# Patient Record
Sex: Male | Born: 1975 | Race: White | Marital: Married | Smoking: Never smoker
Health system: Northeastern US, Academic
[De-identification: ages and names within clinical notes are randomized; demographics above are authoritative.]

## PROBLEM LIST (undated history)

## (undated) DIAGNOSIS — E119 Type 2 diabetes mellitus without complications: Secondary | ICD-10-CM

## (undated) DIAGNOSIS — F419 Anxiety disorder, unspecified: Secondary | ICD-10-CM

## (undated) DIAGNOSIS — E785 Hyperlipidemia, unspecified: Secondary | ICD-10-CM

## (undated) HISTORY — DX: Type 2 diabetes mellitus without complications: E11.9

## (undated) HISTORY — DX: Hyperlipidemia, unspecified: E78.5

## (undated) HISTORY — DX: Anxiety disorder, unspecified: F41.9

---

## 2004-09-09 DIAGNOSIS — N200 Calculus of kidney: Secondary | ICD-10-CM | POA: Insufficient documentation

## 2004-09-09 DIAGNOSIS — K589 Irritable bowel syndrome without diarrhea: Secondary | ICD-10-CM | POA: Insufficient documentation

## 2009-10-17 ENCOUNTER — Encounter: Payer: Self-pay | Admitting: Gastroenterology

## 2009-10-17 ENCOUNTER — Ambulatory Visit: Payer: Self-pay | Admitting: Internal Medicine

## 2009-10-17 ENCOUNTER — Ambulatory Visit
Admit: 2009-10-17 | Discharge: 2009-10-17 | Disposition: A | Discharge status: Home / Self Care | Payer: Self-pay | Source: Ambulatory Visit | Attending: Internal Medicine | Admitting: Internal Medicine

## 2009-10-17 DIAGNOSIS — E785 Hyperlipidemia, unspecified: Secondary | ICD-10-CM | POA: Insufficient documentation

## 2009-10-17 LAB — CBC AND DIFFERENTIAL
Baso # K/uL: 0 THOU/uL (ref 0.0–0.1)
Basophil %: 0.4 % (ref 0.2–1.2)
Eos # K/uL: 0.1 THOU/uL (ref 0.0–0.5)
Eosinophil %: 1.7 % (ref 0.8–7.0)
Hematocrit: 43 % (ref 40–51)
Hemoglobin: 15.7 g/dL (ref 13.7–17.5)
Lymph # K/uL: 3 THOU/uL (ref 1.3–3.6)
Lymphocyte %: 38.1 % (ref 21.8–53.1)
MCV: 84 fL (ref 79–92)
Mono # K/uL: 0.4 THOU/uL (ref 0.3–0.8)
Monocyte %: 4.9 % — ABNORMAL LOW (ref 5.3–12.2)
Neut # K/uL: 4.3 THOU/uL (ref 1.8–5.4)
Platelets: 216 THOU/uL (ref 150–330)
RBC: 5.2 MIL/uL (ref 4.6–6.1)
RDW: 12.5 % (ref 11.6–14.4)
Seg Neut %: 54.9 % (ref 34.0–67.9)
WBC: 7.8 THOU/uL (ref 4.2–9.1)

## 2009-10-17 LAB — COMPREHENSIVE METABOLIC PANEL
ALT: 30 U/L (ref 0–50)
AST: 20 U/L (ref 0–50)
Albumin: 4.6 g/dL (ref 3.5–5.2)
Alk Phos: 52 U/L (ref 40–130)
Anion Gap: 15 (ref 7–16)
Bilirubin,Total: 0.5 mg/dL (ref 0.0–1.2)
CO2: 27 mmol/L (ref 20–28)
Calcium: 9.5 mg/dL (ref 9.0–10.3)
Chloride: 91 mmol/L — ABNORMAL LOW (ref 96–108)
Creatinine: 0.73 mg/dL (ref 0.67–1.17)
GFR,Black: >59 *
GFR,Caucasian: >59 *
Glucose: 525 mg/dL (ref 74–106)
Lab: 13 mg/dL (ref 6–20)
Potassium: 4.1 mmol/L (ref 3.3–5.1)
Sodium: 133 mmol/L (ref 133–145)
Total Protein: 7.6 g/dL (ref 6.3–7.7)

## 2009-10-17 LAB — FOLATE: Folate: 14.3 ng/mL (ref >=4.6)

## 2009-10-17 LAB — VITAMIN B12: Vitamin B12: 560 pg/mL (ref 211–946)

## 2009-10-17 LAB — TSH: TSH: 1.69 u[IU]/mL (ref 0.27–4.20)

## 2009-10-17 NOTE — Progress Notes (Signed)
 Message   Anxiety, neuropathy, weight loss, increased urination and thirst, scalp   lesion.     He thinks he is anxious with the possibility of having some depression and   OCD.  He claims he always had some anxiety with OCD.  He likes everything   in order.  However, he does get more frustrated and upset if things do not   go right away.  Of note, he is busy as an Production designer, theatre/television/film as well as Runner, broadcasting/film/video   at [employer].  He does have a 8-year-old daughter expecting a new child   around September.  He does not get a lot of sleep as he thinks about a lot   of things.  He feels tired the next day because of the lack of sleep.  He   does not have many hobbies.  He doesn't do a lot of things for fun.  He   does like to read but does not have a chance.  He does not exercise regular   basis but is active during the day.     He occasionally will feel some numbness along the lateral aspect of his   index fingers bilaterally.  Also get some funny numbness in his feet.     He is been thirsty.  He is drinking more.  His mouth is dry.  He is   urinating more but is not sure if it's related to drinking more.     He has lost some weight but he does not think he's been trying.  He is down   a good 20 pounds.  He is happy with the weight loss.  Of note, he does not   want to gain weight even with medication side effects or do to his wife   being pregnant.     His libido has been fine.  However, does not match his voice at this time.     He does have a scab on the back of his head to the bottom of his scalp.  It   was raised to 1 point persists.     He is trying to be more careful with his diet but is not perfect.     He has talked about these issues with his wife.  He does not do too many   others about it but thinks his mother is aware.     On physical exam, no acute distress but does appear to be mildly anxious.    Heart rate is on the upper end of normal.  Quite regular.  He is anicteric   not pale.  Conjunctiva not injected.  Throat  without incident or erythema.    He does have a slightly raised normal pigmented area at the back of his   scalp is consistent with an old folliculitis.  Next a full.  No   lymphadenopathy.  Chest is clear auscultation.  CV regular in rhythm, S1,   S2 without murmur or gallop.  Abdomen is benign with positive bowel sounds.    Extremities are without edema.  No gross neurological deficits in his   upper extremities.  2+ distal pulses.     Impression.     Weight loss.  Likely related to anxiety.  We'll do some blood work   including thyroid tests.     Generalized anxiety disorder.  Likely component of OCD.  Unclear of some   depression as well.  Not suicidal or homicidal. He likely needs an  SSRI but   is hesitant to start any medications.  He does not want to speak to a   therapist at this point.  Abdomen the lung work but in talking on the phone   over the next few days to try to start an SSRI like citalopram or   sertraline.     Neuropathy.  I will do some blood work but benign exam.  I do not think he   has a vitamin deficiency but will check.  I do not think he has diabetes   but we'll check a sugar.  I think his increased thirst and urination is due   to anxiety.     Scalp lesion.  Benign.  We'll monitor.     I'll see him back in a few weeks.  Allergies   Penicillins.  Current Meds   None.  Active Problems   Hyperlipidemia (272.4)  Irritable Bowel Syndrome (564.1)  Nephrolithiasis (592.0).  Vital Signs   Recorded by Jackalyn Lombard on 17 Oct 2009 03:10 PM  Temp: 36.8 C,   Weight: 97.2 kg,   Pain Scale: 0.  Recorded by Erasmo Score on 17 Oct 2009 03:10 PM  BP:128/76,  LUE,  Sitting,   HR: 92 b/min, Regular,   Resp: 16 r/min, Normal,   Temp: 36.8 C,   Weight: 97.2 kg,   Pain Scale: 0.  Signature   Electronically signed by: Amador Cunas  MD Attend.; 10/17/2009 5:22 PM   EST; Chartered loss adjuster.

## 2009-10-18 ENCOUNTER — Ambulatory Visit: Payer: Self-pay | Admitting: Internal Medicine

## 2009-10-18 DIAGNOSIS — E119 Type 2 diabetes mellitus without complications: Secondary | ICD-10-CM | POA: Insufficient documentation

## 2009-10-18 NOTE — Progress Notes (Signed)
 Reason For Visit   Diabetes mellitus newly diagnosed.  greater than 15 minutes were discussing   diagnosis, prognosis, treatment.     I saw him yesterday-please see note for further details.     He has been having polyuria, polydipsia for several months.  He thinks it   goes back to about 6 months.  Although he thought it was related to stress,   he also had been worrying about some metabolic process.  He does notice   some numbness in his fingers and feet.  He also has been very hungry.  No   bouts of feeling lightheaded.  he has lost weight.     We discussed diet at length.  we discussed the role of carbohydrates in   moderation.  He also eats sweets intermittently.  Does not exercise on a   regular basis but is active.     He does claim a grandparent did have diabetes.     He does teach high school biology and so is aware of some of the   pathophysiology related to diabetes.  He thinks he can give himself an   insulin injection once daily as well as check his sugars on a regular   basis.  However, he does not want to stay on insulin for every possible.     No physical exam done.     Impression.     Diabetes mellitus newly diagnosed with sugars above 500 and with symptoms.    I will start him on Lantus insulin 10 units and titrate upward.  I want to   avoid hypoglycemia.  We'll have him check sugars on a regular basis.  He   will get instructions for a nurse.  Wife present.  No contact me next week   and I will see him in 2-3 weeks.  We will have him see a nutritionist.  Allergies   Penicillins.  Current Meds   Bayer Contour Test Strip;TEST three times a day and if needed. Dx insulin   controlled DM.; Rx  Lancets Ultra Thin Miscellaneous;test BG 3 times daily and PRN. diabetes on   insulin.; Rx  Lantus SoloStar 100 UNIT/ML Solution;INJECT 10 UNIT DAILY in evening; Rx.  Active Problems   Hyperlipidemia (272.4)  Irritable Bowel Syndrome (564.1)  Nephrolithiasis (592.0).  Family Hx   Family history of Essential  Hypertension  Family history of Type II Diabetes Mellitus.  Vital Signs   Recorded by Jackalyn Lombard on 18 Oct 2009 11:05 AM  Weight: 94.9 kg,   Pain Scale: 0  Recorded by [facility] on 17 Oct 2009 03:10 PM  BP:128/76,  LUE,  Sitting,   HR: 92 b/min, Regular,   Resp: 16 r/min, Normal,   Temp: 36.8 C,   Weight: 97.2 kg,   Pain Scale: 0.  Results   COMPREHENSIVE METABOLIC PROF - CMP   17 Oct 2009 04:15 PM  -   SODIUM: 133 mmol/L  -   POTASSIUM: 4.1 mmol/L  -   CHLORIDE: 91 mmol/L  -   CO2: 27 mmol/L  -   ANION GAP: 15   -   UREA NITROGEN: 13 mg/dL  -   CREATININE: 1.61 mg/dL  -   GFR,CAUCASIAN: > 59  -   GFR,BLACK: > 59  -   GLUCOSE: 525 mg/dL  -   CALCIUM: 9.5 mg/dL  -   TOTAL PROTEIN: 7.6 g/dl  -   ALBUMIN: 4.6 g/dl  -   ALKALINE PHOSPHATASE: 52 u/l  -  T BILI: 0.5 mg/dL  -   AST: 20 u/l  -   ALT: 30 u/l.  Signature   Electronically signed by: Amador Cunas  MD Attend.; 10/18/2009 12:36 PM   EST; Chartered loss adjuster.

## 2009-10-19 LAB — HEMOGLOBIN A1C: Hemoglobin A1C: 13.5 % — ABNORMAL HIGH (ref 4.0–6.0)

## 2009-11-01 ENCOUNTER — Ambulatory Visit: Payer: Self-pay

## 2009-11-07 ENCOUNTER — Ambulatory Visit: Payer: Self-pay | Admitting: Internal Medicine

## 2009-11-08 NOTE — Progress Notes (Signed)
Reason For Visit   Diabetes type 2.     He was recently diagnosed with diabetes.  He had significant symptoms of   polyuria and polydipsia.  He also had weight loss associated with it.    Please see previous notes.  We did extensive teaching with the help of our   nursing staff and dietitian.  He was able to start himself on insulin and   is able to tolerate it reasonably well.  He also checks his sugars about   2-3 times a day.     A review of his sugars, they have gone down from 300 into the 200s.  There   were running in the upper 100s so we increased his Lantus to 12 units.  Now   they are running in the low to mid 100s.     He has not had any symptomatic lows.  The lowest he had was 88.     He did have one high level before supper at 237.  He did not exercise that   day.  He then exercised after supper and his blood sugar was 122.     He is very motivated.     He is less anxious.  He is better able to handle the anxiety.  He is   sleeping better.  He only gets up to urinate on rare occasions.     The neuropathy in his hands and feet are better but he still feels it at   the end of the day.     He has exercised every day since his diagnosis.  He usually exercises for   about an hour.  He likes to run or use a bike.     Of note, office visit lasted between 25 and 30 minutes with greater than   half the time spent in counseling and education.     On physical exam, no acute distress.  Anicteric not pale.  Conjunctiva not   injected.  his neck is supple without lymph nodes.  Chest is clear to   auscultation.  No rashes.  CV regular in rhythm, S1, S2.  Extremities are   without edema.  2+ distal pulses in his feet.  No skin breakdown.  He does   have some mild tinea pedis between his second and third as well as third   and fourth digits.  No ulcers.     Impression.     Diabetes type 2.  He is doing remarkably well considering the recent   diagnosis.  He seems to be handling that she well.  No longer has symptoms   of  hyperglycemia.  As he is only on 12 units of insulin, I will try to get   them on oral agents.  He is very motivated and will continue with his   excellent diet and exercise program.  I will cut back on his insulin for   him to take 10 units at night.  We will add metformin 500 mg.  We will   titrate up the metformin and likely DC his Lantus.  Side effects explained.    He'll contact me next week with readings.  I'll see him back in 3 months   and then we will do blood work including hemoglobin A1c, renal function,   lipid profile, urine microalbumin.  Allergies   Penicillins.  Current Meds   Bayer Contour Test Strip;TEST three times a day and if needed. Dx insulin   controlled  DM.; Rx  Lancets Ultra Thin Miscellaneous;test BG 3 times daily and PRN. diabetes on   insulin.; Rx  Lantus SoloStar 100 UNIT/ML Solution;INJECT 10 UNIT DAILY in evening; Rx  MetFORMIN HCl 500 MG Tablet;TAKE ONE TABLET DAILY; Rx.  Active Problems   Hyperlipidemia (272.4)  Irritable Bowel Syndrome (564.1)  Nephrolithiasis (592.0)  Type II Diabetes Mellitus (250.00).  Vital Signs   Recorded by RAMIREZ,YURELY on 07 Nov 2009 04:21 PM  Temp: 36.3 C,   Weight: 96.3 kg,   Pain Scale: 0.  Recorded by Erasmo Score on 07 Nov 2009 04:21 PM  BP:118/70,  LUE,  Sitting,   HR: 72 b/min, Regular,   Resp: 12 r/min, Normal,   Temp: 36.3 C,   Weight: 96.3 kg,   Pain Scale: 0.  Results   HEMOGLOBIN A1C - HBA1C   17 Oct 2009 04:15 PM  -   HGB A1C: 13.5 %.  Quality Metrics   No recent examination by an ophthalmologist  and not by a podiatrist.  No   tobacco use.  No lesions on the feet.  Signature   Electronically signed by: Amador Cunas  MD Attend.; 11/08/2009 9:14 AM   EST; Chartered loss adjuster.

## 2009-11-14 ENCOUNTER — Ambulatory Visit: Payer: Self-pay | Admitting: Internal Medicine

## 2010-02-13 ENCOUNTER — Ambulatory Visit: Payer: Self-pay | Admitting: Internal Medicine

## 2010-02-14 ENCOUNTER — Ambulatory Visit
Admit: 2010-02-14 | Discharge: 2010-02-14 | Disposition: A | Discharge status: Home / Self Care | Payer: Self-pay | Source: Ambulatory Visit | Attending: Internal Medicine | Admitting: Internal Medicine

## 2010-02-14 LAB — BASIC METABOLIC PANEL
Anion Gap: 10 (ref 7–16)
CO2: 27 mmol/L (ref 20–28)
Calcium: 9.7 mg/dL (ref 9.0–10.3)
Chloride: 103 mmol/L (ref 96–108)
Creatinine: 0.74 mg/dL (ref 0.67–1.17)
GFR,Black: >59 *
GFR,Caucasian: >59 *
Glucose: 125 mg/dL — ABNORMAL HIGH (ref 74–106)
Lab: 18 mg/dL (ref 6–20)
Potassium: 4.3 mmol/L (ref 3.3–5.1)
Sodium: 140 mmol/L (ref 133–145)

## 2010-02-14 LAB — LIPID PANEL
Chol/HDL Ratio: 4.8
Cholesterol: 190 mg/dL
HDL: 40 mg/dL
LDL Calculated: 98 mg/dL
Non HDL Cholesterol: 150 mg/dL
Triglycerides: 259 mg/dL — AB

## 2010-02-15 LAB — MICROALBUMIN, URINE, RANDOM
Creatinine,UR: 326 mg/dL — ABNORMAL HIGH (ref 20–300)
Microalb/Creat Ratio: 5.6 mg MA/g CR (ref 0.0–29.9)
Microalbumin,UR: 1.84 mg/dL

## 2010-02-15 LAB — HEMOGLOBIN A1C: Hemoglobin A1C: 6.9 % — ABNORMAL HIGH (ref 4.0–6.0)

## 2010-02-15 NOTE — Progress Notes (Signed)
Reason For Visit   Diabetes, left hip discomfort.     He was diagnosed with diabetes with a blood sugar above 500 back in   February.  Since that time, he has been on an excellent diet and exercise   program.  He does exercise 2-3 days a week with running and cycling on   other days.  He can cycle up to 3 hours at a time.  He denies any chest   pain, shortness of breath, edema, or palpitations.     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  He realizes that his exercise but thinks he will do well with   this.  He has felt a little down in the past but is much better now.  Of   note, his wife is expecting their second child.     He denies any problems with urine flow.  He is going less frequently if the   sugars are under better control.  His sugars are typically around 100.  He   has not had any symptomatic lows.     He does have some pain in his left hip.  His sister-in-law is a physical   therapist and thinks he may have a mild bursitis.  He is doing some   exercises.  He has tried other things with mild success.  It is overall   getting better.     He denies any neuropathy.  He is normal sensation in his hands and feet.    No lesions in his feet.     On physical exam, no acute distress.  Not anxious or depressed.  He is   anicteric not pale.  Throat without exudate or erythema.  Neck supple.    Chest clear to auscultation.  good air entry.  Regular rate and rhythm, S1,   S2 without murmur or gallop.  extremities without edema.  No rash.  Allergies   Penicillins.  Current Meds   MetFORMIN HCl 500 MG Tablet;2 pills in AM,  1 pill in PM; Rx  Lancets Ultra Thin Miscellaneous;test BG 3 times daily and PRN. diabetes on   insulin.; Rx  Bayer Contour Test Strip;TEST three times a day and if needed. Dx insulin   controlled DM.; Rx.  Active Problems   Hyperlipidemia (272.4)  Irritable Bowel Syndrome (564.1)  Nephrolithiasis (592.0)  Type II Diabetes Mellitus (250.00).  Vital Signs      Recorded by Erasmo Score on 13 Feb 2010 04:12 PM  BP:116/60,  RUE,  Sitting,   HR: 64 b/min, Regular,   Temp: 35.8 C,   Height: 69 in, Weight: 93.9 kg, BMI: 30.6 kg/m2,   Pain Scale: 0.  Assessment   ** Medication reconciliation completed and updated list printed for the   patient. **     DIABETES  --Last HgBA1c according to current ADA practice guidelines above goal Based   on this patient's clinical history the goal is less than 7.   The plan to reach goal includes:  --Medication Management: no changes made    --Diet and Weight control: he has lost some weight and would continue with   an excellent diet and exercise program.  --BMI: above goal: decreased ; discussed exercise and diet  --Referral for Care Management:  no  --Follow up in 4 months.  He was encouraged to continue with his excellent diet and exercise program.    He will continue to check his sugars.  Will check extensive blood work.    I'm sure his hemoglobin A1c is less than 8%.     Left hip discomfort.  He does have some mild point tenderness.  Likely mild   bursitis but improving.  Can treat with anti-inflammatories as well as   topical therapy and exercises.  Will monitor but might need an injection if   things do not improve further.  Signature   Electronically signed by: Amador Cunas  MD Attend.; 02/15/2010 3:03 PM   EST; Chartered loss adjuster.

## 2010-05-22 ENCOUNTER — Ambulatory Visit: Payer: Self-pay

## 2010-06-06 ENCOUNTER — Ambulatory Visit: Payer: Self-pay | Admitting: Internal Medicine

## 2010-06-06 ENCOUNTER — Ambulatory Visit
Admit: 2010-06-06 | Discharge: 2010-06-06 | Disposition: A | Discharge status: Home / Self Care | Payer: Self-pay | Source: Ambulatory Visit | Attending: Internal Medicine | Admitting: Internal Medicine

## 2010-06-06 LAB — BASIC METABOLIC PANEL
Anion Gap: 11 (ref 7–16)
CO2: 26 mmol/L (ref 20–28)
Calcium: 9.3 mg/dL (ref 9.0–10.3)
Chloride: 105 mmol/L (ref 96–108)
Creatinine: 0.78 mg/dL (ref 0.67–1.17)
GFR,Black: >59 *
GFR,Caucasian: >59 *
Glucose: 107 mg/dL — ABNORMAL HIGH (ref 74–106)
Lab: 17 mg/dL (ref 6–20)
Potassium: 4.1 mmol/L (ref 3.3–5.1)
Sodium: 142 mmol/L (ref 133–145)

## 2010-06-06 NOTE — Progress Notes (Signed)
 Reason For Visit   Diabetes type 2, and I disorder.     He thinks his diabetes is under good control.  He's been under some stress   recently as he just had a second daughter.  His appetite has been good and   his diet has been good.  His weight has been steady.  He had been riding   his bike a lot during the summer but has not been able to do as much since   the birth of this child     His sugars have been typically running about 120 fasting but will go higher   up as high as 180 after meals.  He denies any symptoms of hyper or   hypoglycemia.  He has had no bouts of hypoglycemia on readings.     He has been very anxious.  He thinks this is related to work as well as a   newborn.  He has not been enjoying things as much as before.  He has no   suicidal ideation.  He thinks he has less patience or intolerance for   different things.  He has not turned to alcohol or drugs.  it has not   affected his work.  He now is a principal.     He recently got new glasses.  he mainly had a decreased vision in the left   eye.  However, no sign of diabetes.     His left hip wound is all resolved.  He thinks he was due to his bike.  He   has a new bike now and no problems.     Review of systems is otherwise unremarkable.     On physical exam, no acute distress although question mildly anxious.    mentation intact.  Anicteric not pale.  Throat without exudate or erythema.    neck supple without lymphadenopathy.  Chest is clear to auscultation.  CV   regular rate and rhythm, S1, S2, no murmur or gallop.  extremities are   without edema.  No rashes.  Gait is intact.  Allergies   Penicillins.  Current Meds   Citalopram Hydrobromide 20 MG Tablet;TAKE 1 TABLET DAILY AS DIRECTED.; Rx  Lancets Ultra Thin Miscellaneous;test BG 3 times daily and PRN. diabetes on   insulin.; Rx  Bayer Contour Test Strip;TEST three times a day and if needed. Dx insulin   controlled DM.; Rx  MetFORMIN HCl 500 MG Tablet;TAKE 1 TABLET IN THE MORNING AND TAKE 2  TABLETS   WITH DINNER; Rx.  Active Problems   Hyperlipidemia (272.4)  Irritable Bowel Syndrome (564.1)  Nephrolithiasis (592.0)  Type II Diabetes Mellitus (250.00).  Vital Signs   Recorded by RAMIREZ,YURELY on 06 Jun 2010 10:19 AM  BP:124/74,   HR: 82 b/min,   Temp: 36.5 C,   Height: 69.0 in, Weight: 94.8 kg, BMI: 30.9 kg/m2,   Pain Scale: 0.  Results   LIPID PROFILE - LIPID   14 Feb 2010 08:03 AM  -   CHOLESTEROL: 190 mg/dL  -   TRIGLYCERIDES: 751 mg/dL  -   HDL: 40 mg/dL  -   LDL (CALC): 98 mg/dL  -   CHOL/HDL RATIO: 4.8   -   NON HDL CHOLESTEROL: 150 mg/dL  MICROALBUMIN, RANDOM - UMP   14 Feb 2010 08:03 AM  -   CREATININE,UR: 326 mg/dL  -   MICROALB, RAW: 0.25 mg/dL  -   MA/CR RATIO: 5.6 mg MA/g CR  HEMOGLOBIN A1C -  HBA1C   14 Feb 2010 08:03 AM  -   HGB A1C: 6.9 %.  Quality Metrics   A recent examination by an ophthalmologist.  No recent examination by a   podiatrist.  No tobacco use.  No lesions on the feet.  Assessment   ** Medication reconciliation completed and updated list printed for the   patient. **     DIABETES  --Last HgBA1c according to current ADA practice guidelines at goal.;   discussed the A1C goal with the patient. Based on this patient's clinical   history the goal is less than 7.   The plan to reach goal includes:  --Medication Management: no changes made    --Diet and Weight control:   --BMI: above goal: stable ; discussed exercise and diet  --Referral for Care Management:  no  --Follow up in 4 months.  He seems to be quite successful in what he is   doing.     Anxiety disorder with question depression.  We'll start him on citalopram.    the potential side effects were discussed.  He is unable to see a therapist   due to his scheduled.  I will start 10 mg for one week and then 20 mg.    Seen him a month.  Signature   Electronically signed by: Amador Cunas  MD Attend.; 06/06/2010 12:57 PM   EST; Chartered loss adjuster.

## 2010-06-07 LAB — HEMOGLOBIN A1C: Hemoglobin A1C: 6.5 % — ABNORMAL HIGH (ref 4.0–6.0)

## 2010-06-14 ENCOUNTER — Ambulatory Visit: Payer: Self-pay | Admitting: Internal Medicine

## 2010-09-03 NOTE — Miscellaneous (Unsigned)
 Continuity of Care Record  Created: todo  From: ,   From:   From: [employer], EHR v10.2.7.53  To: Dale Schultz  Purpose: Patient Use;       Problems  Diagnosis: Hyperlipidemia (272.4)   Diagnosis: Irritable Bowel Syndrome (564.1)   Diagnosis: Nephrolithiasis (592.0)   Diagnosis: Type II Diabetes Mellitus (250.00)     Family History  Family history of Essential Hypertension  Family history of Type II Diabetes Mellitus    Alerts  Allergy - Penicillins     Medications  Bayer Contour Test Strip; TEST three times a day and if needed. Dx insulin   controlled DM. ; Rx   Bayer Microlet Lancets Miscellaneous; TEST BLOOD GLUCOSE 3 TIMES DAILY AND   AS NEEDED. DIABETES ON INSULIN. ; Rx   Citalopram Hydrobromide 20 MG Tablet; TAKE 1 TABLET DAILY AS DIRECTED. ; Rx   Lancets Ultra Thin Miscellaneous; test BG 3 times daily and PRN. diabetes   on insulin. ; Rx   MetFORMIN HCl 500 MG Tablet; TAKE 1 TABLET IN THE MORNING AND TAKE 2   TABLETS WITH DINNER ; Rx     Immunizations  Td   * Influenza   * Influenza   Tdap (Adacel)

## 2010-11-13 ENCOUNTER — Ambulatory Visit: Payer: Self-pay | Admitting: Internal Medicine

## 2010-11-13 ENCOUNTER — Encounter: Payer: Self-pay | Admitting: Internal Medicine

## 2010-11-13 ENCOUNTER — Ambulatory Visit
Admit: 2010-11-13 | Discharge: 2010-11-13 | Disposition: A | Discharge status: Home / Self Care | Payer: Self-pay | Source: Ambulatory Visit | Attending: Internal Medicine | Admitting: Internal Medicine

## 2010-11-13 LAB — BASIC METABOLIC PANEL
Anion Gap: 13 (ref 7–16)
CO2: 27 mmol/L (ref 20–28)
Calcium: 9.2 mg/dL (ref 9.0–10.3)
Chloride: 101 mmol/L (ref 96–108)
Creatinine: 0.86 mg/dL (ref 0.67–1.17)
GFR,Black: >59 *
GFR,Caucasian: >59 *
Glucose: 107 mg/dL — ABNORMAL HIGH (ref 74–106)
Lab: 18 mg/dL (ref 6–20)
Potassium: 4.2 mmol/L (ref 3.3–5.1)
Sodium: 141 mmol/L (ref 133–145)

## 2010-11-14 LAB — HEMOGLOBIN A1C: Hemoglobin A1C: 6.8 % — ABNORMAL HIGH (ref 4.0–6.0)

## 2010-11-14 LAB — HM DIABETES FOOT EXAM

## 2010-11-14 NOTE — Progress Notes (Signed)
 Reason For Visit   Diabetes mellitus type 2, anxiety disorder.     He thinks his sugars have been a little higher than before.  He got his   hemoglobin A1c down to 6.5%.  He has not been as careful with his diet and   has gained about 10 pounds.  He also has not been exercising as much as he   would like.  He had been biking a lot but was unable to in the winter   months.  His a.m. blood sugars are between 140 is high as 200.  He did have   a p.m. sugar the other day and 97.  No symptoms of hyperglycemia.  No   symptoms of hypoglycemia.     He's been quite busy as he is now principal of a middle school.  He is   adjusting to that well.  He also has a newborn as well as another child.    He has not had time for other activities because of these things.     He did notice by raking leaves his right elbow bothers him.  His been   acting up intermittently if he uses it all.  No direct injury otherwise.     He needs adjustment of his glasses.  Left eye does not seem to see as well   as he use to despite getting new glasses last August.     His anxiety is much better.  He is much calmer.  He does not have as labile   mood.     Review of systems otherwise well.  No neuropathy symptoms.  No urinary   symptoms.  Bowel movements are at baseline.  Tolerating his medications   well.     On physical examination, no acute distress.  In good spirits.  Appears calm   and relaxed.  Conjunctiva not injected.  Anicteric not pale.  Throat   without exudate or erythema.  Neck supple.  No lymphadenopathy.  Chest   auscultation without wheeze.  CV regular in rhythm, S1, S2.  No peripheral   edema.  No skin lesions.  No skin breakdown.  No rashes.  Allergies   Penicillins.  Current Meds   Citalopram Hydrobromide 20 MG Tablet;TAKE 1 TABLET DAILY AS DIRECTED.; Rx  MetFORMIN HCl 500 MG Tablet;TAKE 1 TABLET IN THE MORNING AND TAKE 2 TABLETS   WITH DINNER; Rx  Lancets Ultra Thin Miscellaneous;test BG 3 times daily and PRN. diabetes on    insulin.; Rx  Bayer Contour Test Strip;TEST three times a day and if needed. Dx insulin   controlled DM.; Rx.  Active Problems   Hyperlipidemia (272.4)  Irritable Bowel Syndrome (564.1)  Nephrolithiasis (592.0)  Type II Diabetes Mellitus (250.00).  Vital Signs   Recorded by Carmon Sails on 13 Nov 2010 04:17 PM  BP:122/88,   HR: 92 b/min,   Weight: 99.5 kg,   Pain Scale: 0.  Results   HEMOGLOBIN A1C - HBA1C   06 Jun 2010 11:07 AM  -   HGB A1C: 6.5 %  LIPID PROFILE - LIPID   14 Feb 2010 08:03 AM  -   CHOLESTEROL: 190 mg/dL  -   TRIGLYCERIDES: 621 mg/dL  -   HDL: 40 mg/dL  -   LDL (CALC): 98 mg/dL  -   CHOL/HDL RATIO: 4.8   -   NON HDL CHOLESTEROL: 150 mg/dL  MICROALBUMIN, RANDOM - UMP   14 Feb 2010 08:03 AM  -   CREATININE,UR:  326 mg/dL  -   MICROALB, RAW: 0.96 mg/dL  -   MA/CR RATIO: 5.6 mg MA/g CR.  Quality Metrics   A recent examination by an ophthalmologist.  No recent examination by a   podiatrist.  No tobacco use.  No lesions on the feet.  Assessment   ** Medication reconciliation completed and updated list printed for the   patient. **     DIABETES  --Last HgBA1c according to current ADA practice guidelines at goal.;   discussed the A1C goal with the patient. Based on this patient's clinical   history the goal is less than 7.   The plan to reach goal includes:  --Medication Management: no changes made    --Diet and Weight control:   --BMI: above goal: increased ; discussed exercise and diet  --Referral for Care Management:  no  --Follow up in 4 months.  We will check a hemoglobin A1c today.  He might   need increasing metformin.  He needs to lose weight with exercise and diet.    He'll try to work this into his schedule.     Generalized anxiety disorder.  Doing quite well on citalopram.  We will   continue this regimen.  Signature   Electronically signed by: Amador Cunas  MD Attend.; 11/14/2010 12:41 PM   EST; Chartered loss adjuster.

## 2011-02-25 LAB — HM DIABETES EYE EXAM

## 2011-03-17 ENCOUNTER — Encounter: Payer: Self-pay | Admitting: Internal Medicine

## 2011-03-18 ENCOUNTER — Encounter: Payer: Self-pay | Admitting: Internal Medicine

## 2011-03-18 ENCOUNTER — Ambulatory Visit: Payer: Self-pay | Admitting: Internal Medicine

## 2011-03-18 VITALS — BP 118/80 | HR 72 | Ht 69.0 in | Wt 218.7 lb

## 2011-03-18 DIAGNOSIS — E119 Type 2 diabetes mellitus without complications: Secondary | ICD-10-CM

## 2011-03-18 LAB — BASIC METABOLIC PANEL
Anion Gap: 17 — ABNORMAL HIGH (ref 7–16)
CO2: 23 mmol/L (ref 20–28)
Calcium: 9.5 mg/dL (ref 9.0–10.3)
Chloride: 97 mmol/L (ref 96–108)
Creatinine: 0.78 mg/dL (ref 0.67–1.17)
GFR,Black: >59 *
GFR,Caucasian: >59 *
Glucose: 289 mg/dL — ABNORMAL HIGH (ref 60–99)
Lab: 15 mg/dL (ref 6–20)
Potassium: 4.3 mmol/L (ref 3.3–5.1)
Sodium: 137 mmol/L (ref 133–145)

## 2011-03-18 LAB — HEMOGLOBIN A1C: Hemoglobin A1C: 8 % — ABNORMAL HIGH (ref 4.0–6.0)

## 2011-03-18 LAB — MICROALBUMIN, URINE, RANDOM
Creatinine,UR: 38 mg/dL (ref 20–300)
Microalb/Creat Ratio: <7.9 mg MA/g CR (ref 0.0–29.9)
Microalbumin,UR: <0.3 mg/dL

## 2011-03-18 LAB — TSH: TSH: 2.51 u[IU]/mL (ref 0.27–4.20)

## 2011-03-19 ENCOUNTER — Encounter: Payer: Self-pay | Admitting: Internal Medicine

## 2011-03-19 ENCOUNTER — Other Ambulatory Visit: Payer: Self-pay | Admitting: Internal Medicine

## 2011-03-19 MED ORDER — METFORMIN HCL 1000 MG PO TABS *I*
1000.0000 mg | ORAL_TABLET | Freq: Two times a day (BID) | ORAL | Status: DC
Start: 2011-03-19 — End: 2011-05-08

## 2011-03-19 NOTE — Progress Notes (Signed)
Subjective:     Patient ID: Dale Schultz is a 35 y.o. male.    HPI diabetes.    He thinks he is doing reasonably well.  He was his sugars showed that he fasting a.m. Is about 120.  However, sometimes later in the day for she does suffer he is 200.  He's been trying to keep his weight down.  He's exercising more.  He is trying to be less.  He is under some stress now because he is in the process of moving but is currently staying at his in-laws house prior to moving into the new place.    He denies any chest pain, shortness of breath, edema, palpitations.    He did have eye surgery and had a cataract removed.  His vision is much better.    He denies any fevers or chills.    Dale Schultz has Irritable Bowel Syndrome; Nephrolithiasis; Hyperlipidemia; and Type II Diabetes Mellitus on his problem list.  Current Outpatient Prescriptions on File Prior to Visit   Medication Sig Dispense Refill   . citalopram (CELEXA) 20 MG tablet TAKE 1 TABLET DAILY AS DIRECTED.  30  5   . metFORMIN (GLUCOPHAGE) 500 MG tablet TAKE 1 TABLET IN THE MORNING AND TAKE 2 TABLETS WITH DINNER  90  6   . glucose blood VI test strips (BAYER CONTOUR TEST) test strip TEST three times a day and if needed. Dx insulin controlled DM.  1  5   . LANCETS ULTRA THIN MISC test BG 3 times daily and PRN. diabetes on insulin.  1  5     Dale Schultz is allergic to penicillins.    Review of Systems    As above    Objective:   Physical Exam    No acute distress.  Head and neck exam is unremarkable. Chest clear to auscultation.  CV regular in rhythm, S1, S2.  Extremities without edema.    Assessment:  Diabetes type 2.  He has been under reasonable control based on A1c but it appears that his postprandial sugars are a little elevated.  Will stick with strict diet.  Might need to change his oral agents.  I'll see him back in 4 months.  Repeat blood work today.   Plan:  followup in 4 months.  Repeat blood work today.

## 2011-04-28 ENCOUNTER — Telehealth: Payer: Self-pay | Admitting: Internal Medicine

## 2011-04-28 DIAGNOSIS — R21 Rash and other nonspecific skin eruption: Secondary | ICD-10-CM

## 2011-04-28 MED ORDER — MICONAZOLE NITRATE 2 % EX OINT *I*
TOPICAL_OINTMENT | Freq: Two times a day (BID) | CUTANEOUS | Status: DC
Start: 2011-04-28 — End: 2011-04-28

## 2011-04-28 MED ORDER — FLUCONAZOLE 200 MG PO TABS *I*
200.0000 mg | ORAL_TABLET | Freq: Every day | ORAL | Status: DC
Start: 2011-04-28 — End: 2011-05-01

## 2011-04-28 MED ORDER — MICONAZOLE NITRATE 2 % EX OINT *I*
TOPICAL_OINTMENT | Freq: Two times a day (BID) | CUTANEOUS | Status: DC
Start: 2011-04-28 — End: 2011-05-01

## 2011-04-28 MED ORDER — FLUCONAZOLE 200 MG PO TABS *I*
200.0000 mg | ORAL_TABLET | Freq: Every day | ORAL | Status: DC
Start: 2011-04-28 — End: 2011-04-28

## 2011-04-28 NOTE — Telephone Encounter (Signed)
Having an itch rash in his groin, scrotum and penis.  Has been working outside and sweaty.  Feels like it is Candida that he has had before.  Dr. Gerlene Burdock treated him with a pill which was effective.  I will call in fluconazole pill for 3 days and miconazole ointment for him.

## 2011-04-28 NOTE — Telephone Encounter (Signed)
Addended by: Maylene Roes on: 04/28/2011 03:16 PM     Modules accepted: Orders

## 2011-04-29 ENCOUNTER — Telehealth: Payer: Self-pay | Admitting: Internal Medicine

## 2011-04-29 NOTE — Telephone Encounter (Signed)
Please let the pharmacist know we are aware of interaction and should be OK for the few days he is on the fluconazole.  thanks

## 2011-04-29 NOTE — Telephone Encounter (Signed)
John/ [facility] pharmacy is calling about drug interaction w/ fluconazole & Citalopram.  Asking to be called back @ number provided.

## 2011-04-29 NOTE — Telephone Encounter (Signed)
Information given to the pharmacist

## 2011-05-01 ENCOUNTER — Ambulatory Visit: Payer: Self-pay | Admitting: Internal Medicine

## 2011-05-01 ENCOUNTER — Telehealth: Payer: Self-pay | Admitting: Internal Medicine

## 2011-05-01 ENCOUNTER — Encounter: Payer: Self-pay | Admitting: Internal Medicine

## 2011-05-01 VITALS — BP 120/90 | HR 76 | Wt 209.0 lb

## 2011-05-01 DIAGNOSIS — L259 Unspecified contact dermatitis, unspecified cause: Secondary | ICD-10-CM

## 2011-05-01 MED ORDER — PREDNISONE 20 MG PO TABS *I*
ORAL_TABLET | ORAL | Status: DC
Start: 2011-05-01 — End: 2011-05-08

## 2011-05-01 MED ORDER — ECONAZOLE NITRATE 1 % EX CREA *I*
TOPICAL_CREAM | CUTANEOUS | Status: DC
Start: 2011-05-01 — End: 2011-05-01

## 2011-05-01 NOTE — Telephone Encounter (Signed)
I could see him at 11:30 today if that works for him.  Thanks

## 2011-05-01 NOTE — Telephone Encounter (Signed)
Pt is requesting a an appt ASAP.  He said that the rash that he called about on 9.3.2012 is getting much worse.  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  The rash is also spreading all over.

## 2011-05-01 NOTE — Telephone Encounter (Signed)
Patient with c/o worsening rash.  States rash started 3 weeks ago in groin area; now spread on arms and face.  No visual changes.  Asking to be seen.

## 2011-05-01 NOTE — Progress Notes (Signed)
Subjective:     Patient ID: Dale Schultz is a 35 y.o. male.    HPI rash.    Starting about 4-5 days ago, he noticed a rash mainly in the inguinal area.  It was red, swollen, and somewhat painful.  Thinking it might be a yeast infection, he called the on-call doctor and received a prescription for.  However, the rash is getting worse.  It is painful.  He still has the redness and swelling.  He now notices also the rash is elsewhere.  He notices some light his face, neck, arms.  He is on his feet.  Does recall working in a wooded area by his house over the weekend and was exposed to poison ivy for which he is very allergic.    No mouth involvement.  No breathing problems.  He is still able to work as a principal.  Has tried some Benadryl but that has made him sleepy.    Dale Schultz has Irritable Bowel Syndrome; Nephrolithiasis; Hyperlipidemia; and Type II Diabetes Mellitus on his problem list.  Dale Schultz is allergic to penicillins.    Review of Systems    As above.  No fevers or chills.  Eating well.    Objective:   Physical Exam    No acute distress.  He does have an erythematous rash on the medial aspect of his left upper eyelid.  Also some scattered over his neck.  Confluent region in his right antecubital fossa.  Lesions by his left wrist.  He has diffuse erythema and swelling in his inguinal and scrotal area.  Some mild involvement of perianal area.    Assessment:  Contact dermatitis.  Likely poison ivy.  We'll treat with prednisone.  Did discuss that his sugars might be elevated.  I do not think this is the primary yeast infection although the prednisone might contribute to that.  I will treat him with an antifungal agent topically as well.   Plan:  As above.  Follow up as needed.  He'll contact me in a few days for an update.

## 2011-05-01 NOTE — Patient Instructions (Signed)
Call me next Monday with update.    Stop Nystatin.    Try during day - Claritin, Zyrtec, or Allegra.  Try during night - Benadryl.

## 2011-05-01 NOTE — Telephone Encounter (Signed)
APPOINTMENT SCHEDULED

## 2011-05-06 ENCOUNTER — Encounter: Payer: Self-pay | Admitting: Internal Medicine

## 2011-05-07 ENCOUNTER — Other Ambulatory Visit: Payer: Self-pay | Admitting: Internal Medicine

## 2011-05-07 ENCOUNTER — Encounter: Payer: Self-pay | Admitting: Internal Medicine

## 2011-05-08 MED ORDER — CITALOPRAM HYDROBROMIDE 20 MG PO TABS *I*
20.0000 mg | ORAL_TABLET | Freq: Every day | ORAL | Status: DC
Start: 2011-05-08 — End: 2011-11-25

## 2011-05-08 MED ORDER — METFORMIN HCL 1000 MG PO TABS *I*
1000.0000 mg | ORAL_TABLET | Freq: Two times a day (BID) | ORAL | Status: DC
Start: 2011-05-08 — End: 2011-11-25

## 2011-07-11 ENCOUNTER — Other Ambulatory Visit: Payer: Self-pay | Admitting: Internal Medicine

## 2011-07-11 MED ORDER — AZITHROMYCIN 250 MG PO TABS *I*
ORAL_TABLET | ORAL | Status: DC
Start: 2011-07-11 — End: 2011-12-10

## 2011-07-14 ENCOUNTER — Other Ambulatory Visit: Payer: Self-pay | Admitting: Internal Medicine

## 2011-11-25 ENCOUNTER — Encounter: Payer: Self-pay | Admitting: Gastroenterology

## 2011-11-25 ENCOUNTER — Other Ambulatory Visit: Payer: Self-pay | Admitting: Internal Medicine

## 2011-11-25 ENCOUNTER — Telehealth: Payer: Self-pay | Admitting: Internal Medicine

## 2011-11-25 NOTE — Telephone Encounter (Signed)
Medication renewal routed to on call provider on 11/25/2011 at 10:49 AM due to Dr.Richard being unavailable.

## 2011-11-25 NOTE — Telephone Encounter (Signed)
Pt called stating he has diabetes and hasnt been in to see his PCP in a while and would like an appointment. Call [place] at number provided regarding this information

## 2011-11-25 NOTE — Telephone Encounter (Signed)
Spoke with patient. Appt scheduled 4/17 @ 4:10pm.

## 2011-12-10 ENCOUNTER — Encounter: Payer: Self-pay | Admitting: Internal Medicine

## 2011-12-10 ENCOUNTER — Ambulatory Visit: Payer: Self-pay | Admitting: Internal Medicine

## 2011-12-10 VITALS — BP 122/76 | HR 76 | Ht 69.02 in | Wt 220.5 lb

## 2011-12-10 DIAGNOSIS — E119 Type 2 diabetes mellitus without complications: Secondary | ICD-10-CM

## 2011-12-10 LAB — LIPID PANEL
Chol/HDL Ratio: 6.3
Cholesterol: 225 mg/dL — AB
HDL: 36 mg/dL
Non HDL Cholesterol: 189 mg/dL
Triglycerides: 522 mg/dL — AB

## 2011-12-10 LAB — BASIC METABOLIC PANEL
Anion Gap: 13 (ref 7–16)
CO2: 27 mmol/L (ref 20–28)
Calcium: 9.8 mg/dL (ref 9.0–10.3)
Chloride: 100 mmol/L (ref 96–108)
Creatinine: 0.88 mg/dL (ref 0.67–1.17)
GFR,Black: 126 *
GFR,Caucasian: 109 *
Glucose: 189 mg/dL — ABNORMAL HIGH (ref 60–99)
Lab: 16 mg/dL (ref 6–20)
Potassium: 3.9 mmol/L (ref 3.3–5.1)
Sodium: 140 mmol/L (ref 133–145)

## 2011-12-10 LAB — LDL CHOLESTEROL, DIRECT: LDL Direct: 112 mg/dL

## 2011-12-11 LAB — HEMOGLOBIN A1C: Hemoglobin A1C: 8.4 % — ABNORMAL HIGH (ref 4.0–6.0)

## 2011-12-11 NOTE — Progress Notes (Signed)
Subjective:     Patient ID: Dale Schultz is a 36 y.o. male.    HPI diabetes.    Is not seeing me since September for contact dermatitis but has not been in for a evaluation for diabetes since July 2012.  He has been busy working as well as helping to raise his family have 2 young children.  He still exercises on a regular basis.  He is looking forward to riding his bike more the spring and summer.  Denies any visual complaints.  He sees much better after having cataract surgery.  He does not check his sugars.  He denies any symptomatic highs or lows.  He continues to take his metformin on a regular basis.    He has not had blood work done recently.  He denies chest pain, she is of breath, edema, palpitations.  No changes in bowel habits.  No changes in urine flow.  No skin lesions on his feet.    On physical exam, no acute distress.  Anicteric not pale.  Chest clear to auscultation.  CV regular in rhythm, S1, S2 without murmur.  Extremities without edema.    Impression.    Diabetes type 2.  Unclear control.  Needs labs today.  He's been fasting for about 5 hours.  He needs a followup with me on a regular basis and I will see him in the summer.   He needs to lose some weight and he will continue with exercise and diet.    Nasir has Irritable Bowel Syndrome; Nephrolithiasis; Hyperlipidemia; and Type II Diabetes Mellitus on his problem list.  Current Outpatient Prescriptions on File Prior to Visit   Medication Sig Dispense Refill   . citalopram (CELEXA) 20 MG tablet TAKE ONE TABLET BY MOUTH EVERY DAY  30 tablet  5   . metFORMIN (GLUCOPHAGE) 1000 MG tablet TAKE ONE TABLET BY MOUTH TWICE A DAY WITH MEALS  60 tablet  5   . glucose blood VI test strips (BAYER CONTOUR TEST) test strip TEST three times a day and if needed. Dx insulin controlled DM.  1  5   . LANCETS ULTRA THIN MISC test BG 3 times daily and PRN. diabetes on insulin.  1  5     Nyaire is allergic to penicillins.    Review of Systems   Physical Exam

## 2011-12-23 ENCOUNTER — Telehealth: Payer: Self-pay | Admitting: Internal Medicine

## 2011-12-23 MED ORDER — AZITHROMYCIN 250 MG PO TABS *I*
ORAL_TABLET | ORAL | Status: AC
Start: 2011-12-23 — End: 2011-12-28

## 2011-12-23 NOTE — Telephone Encounter (Signed)
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 Wants to know if he can have a Z pac script written for him that he can pick up today. Pls call him either way at [phone]

## 2011-12-23 NOTE — Telephone Encounter (Signed)
Call to patient. Informed him that script was sent to [facility].

## 2011-12-23 NOTE — Telephone Encounter (Signed)
I sent script to pharmacy - thanks

## 2011-12-23 NOTE — Telephone Encounter (Signed)
Dr. Gerlene Burdock:  Please see below and advise. Would you like patient to be seen?

## 2012-04-04 ENCOUNTER — Telehealth: Payer: Self-pay | Admitting: Internal Medicine

## 2012-04-04 NOTE — Telephone Encounter (Signed)
Likely kidney stone with pain in groin.  Some N/V.  Took wife's hydrocodone 5/500 and feeling better.  Advised to push liquids, can take 6 pills of hydrocodone daily.  Advised if fevers, N/V, increasing pain to have someone drive to ED.

## 2012-04-08 ENCOUNTER — Encounter: Payer: Self-pay | Admitting: Internal Medicine

## 2012-04-09 ENCOUNTER — Encounter: Payer: Self-pay | Admitting: Internal Medicine

## 2012-04-09 ENCOUNTER — Telehealth: Payer: Self-pay

## 2012-04-09 ENCOUNTER — Ambulatory Visit: Payer: Self-pay | Admitting: Internal Medicine

## 2012-04-09 VITALS — BP 124/88 | HR 78 | Temp 96.6°F | Ht 69.02 in | Wt 212.3 lb

## 2012-04-09 DIAGNOSIS — E119 Type 2 diabetes mellitus without complications: Secondary | ICD-10-CM

## 2012-04-09 DIAGNOSIS — N200 Calculus of kidney: Secondary | ICD-10-CM

## 2012-04-09 LAB — URINALYSIS WITH REFLEX TO MICROSCOPIC
Glucose,UA: >=500 mg/dL — AB
Leuk Esterase,UA: NEGATIVE
Nitrite,UA: NEGATIVE
Protein,UA: NEGATIVE mg/dL
Specific Gravity,UA: 1.024 (ref 1.002–1.030)
pH,UA: 6 (ref 5.0–8.0)

## 2012-04-09 LAB — BASIC METABOLIC PANEL
Anion Gap: 14 (ref 7–16)
CO2: 24 mmol/L (ref 20–28)
Calcium: 9.5 mg/dL (ref 9.0–10.3)
Chloride: 96 mmol/L (ref 96–108)
Creatinine: 0.81 mg/dL (ref 0.67–1.17)
GFR,Black: 130 *
GFR,Caucasian: 113 *
Glucose: 329 mg/dL — ABNORMAL HIGH (ref 60–99)
Lab: 21 mg/dL — ABNORMAL HIGH (ref 6–20)
Potassium: 4.2 mmol/L (ref 3.3–5.1)
Sodium: 134 mmol/L (ref 133–145)

## 2012-04-09 LAB — URINE MICROSCOPIC (IQ200)
RBC,UA: 86 /hpf — ABNORMAL HIGH (ref 0–2)
WBC,UA: 1 /hpf (ref 0–5)

## 2012-04-09 LAB — HEMOGLOBIN A1C: Hemoglobin A1C: 8.3 % — ABNORMAL HIGH (ref 4.0–6.0)

## 2012-04-09 LAB — HM HIV SCREENING OFFERED

## 2012-04-09 MED ORDER — LANCETS ULTRA THIN MISC
Status: AC
Start: 2012-04-09 — End: ?

## 2012-04-09 MED ORDER — BAYER CONTOUR TEST VI STRP *A*
ORAL_STRIP | Status: AC
Start: 2012-04-09 — End: ?

## 2012-04-09 MED ORDER — TRIAMCINOLONE ACETONIDE 0.1 % EX CREA *I*
TOPICAL_CREAM | Freq: Two times a day (BID) | CUTANEOUS | Status: DC
Start: 2012-04-09 — End: 2016-09-05

## 2012-04-09 NOTE — Telephone Encounter (Signed)
Message left for the patient

## 2012-04-09 NOTE — Telephone Encounter (Deleted)
Message copied by Richardean Canal on Fri Apr 09, 2012  6:06 PM  ------       Message from: [place], ERIC A       Created: Fri Apr 09, 2012  1:41 PM       Regarding: cream for poison ivy         I forgot to tell him at his visit that I did prescribe a cream for his poison ivy.  Please let him know.  OK to leave message or tell wife.  Thanks   ------

## 2012-04-09 NOTE — Telephone Encounter (Signed)
Message copied by Richardean Canal on Fri Apr 09, 2012  6:05 PM  ------       Message from: [place], ERIC A       Created: Fri Apr 09, 2012  1:41 PM       Regarding: cream for poison ivy         I forgot to tell him at his visit that I did prescribe a cream for his poison ivy.  Please let him know.  OK to leave message or tell wife.  Thanks   ------

## 2012-04-09 NOTE — Progress Notes (Signed)
Subjective:     Patient ID: Dale Schultz is a 36 y.o. male.    HPI diabetes, kidney stone.    Dale Schultz see my note from a few days ago on phone call.  At that time, Dale Schultz was having abdominal pain due to a kidney stone.  Dale Schultz has trouble getting comfortable.  She did have 3 bouts of vomiting.  Advised him to go to the emergency department but as Dale Schultz was waiting in the car and ago, Dale Schultz was feeling better.  Is able to keep down liquids.  Dale Schultz did take some of his wife's analgesics.  Dale Schultz did pass some blood in his urine.  Dale Schultz no longer has any symptoms.    Dale Schultz's been trying to push fluids.  Dale Schultz has not been drinking as much during a period of time prior to stone.  No fevers or chills.  No for other nausea or vomiting.  No diarrhea.    Last hemoglobin A1c was elevated.  His lost a little weight and Dale Schultz is trying to be careful with his diet but unfortunately Dale Schultz has not been able to exercise as much as Dale Schultz would like.  Did check his sugar last night it was 300.  Dale Schultz then exercising for it down to 200.  This morning, it is 300 again.    Dale Schultz has noticed some poison ivy his left antecubital fossa.  Dale Schultz is trying hydrocortisone with some but incomplete relief.    Dale Schultz's under a lot of stress due to work issues.  We discussed this in detail and I to handle stress.    Dale Schultz does get IBS with loose stools intermittently but that is stable.    Review of systems otherwise unremarkable.    On physical exam, no acute distress.  Dale Schultz is anicteric not pale.  Conjunctiva not injected.  Throat without exudate or erythema.  No cervical lymph nodes.  Chest clear to auscultation without crackles or wheeze.  CV regular in rhythm, S1, S2 without murmur or gallop.  Extremities are without edema.  Abdomen is positive bowel sounds, soft, nontender nondistended.  No hepatosplenomegaly.  No CVA tenderness.  Does have slightly raised erythematous rash left antecubital fossa.    Impression.    Diabetes type 2.  Not well-controlled.  Dale Schultz'll continue with metformin but Dale Schultz might  need to add another agent either glipizide versus Lantus insulint.  Dale Schultz'll try to be more careful with his exercise program noted to lose weight.  I renewed his test strips and lancets.  I'll see him back in 3 months.    Renal stones.  I'll check a UA.  Dale Schultz was advised to drink a lot of liquids.  Dale Schultz might benefit from biochemical analysis.    Contact dermatitis likely from poison ivy.  I'll try triamcinolone cream.    Dale Schultz has Irritable Bowel Syndrome; Nephrolithiasis; Hyperlipidemia; and Type II Diabetes Mellitus on his problem list.  Current Outpatient Prescriptions on File Prior to Visit   Medication Sig Dispense Refill   . citalopram (CELEXA) 20 MG tablet TAKE ONE TABLET BY MOUTH EVERY DAY  30 tablet  5   . metFORMIN (GLUCOPHAGE) 1000 MG tablet TAKE ONE TABLET BY MOUTH TWICE A DAY WITH MEALS  60 tablet  5     No current facility-administered medications on file prior to visit.     Dale Schultz is allergic to penicillins.    Review of Systems  Physical Exam

## 2012-04-10 LAB — MICROALBUMIN, URINE, RANDOM
Creatinine,UR: 80 mg/dL (ref 20–300)
Microalb/Creat Ratio: 7.9 mg MA/g CR (ref 0.0–29.9)
Microalbumin,UR: 0.63 mg/dL

## 2012-05-24 ENCOUNTER — Other Ambulatory Visit: Payer: Self-pay | Admitting: Primary Care

## 2012-05-24 NOTE — Telephone Encounter (Signed)
Rx request routed to provider on 05/24/2012 at 8:50 AM

## 2012-06-08 ENCOUNTER — Encounter: Payer: Self-pay | Admitting: Internal Medicine

## 2012-06-08 ENCOUNTER — Ambulatory Visit: Payer: Self-pay | Admitting: Internal Medicine

## 2012-06-08 ENCOUNTER — Telehealth: Payer: Self-pay | Admitting: Internal Medicine

## 2012-06-08 VITALS — BP 130/90 | HR 84 | Temp 97.2°F | Ht 69.0 in | Wt 215.3 lb

## 2012-06-08 DIAGNOSIS — J329 Chronic sinusitis, unspecified: Secondary | ICD-10-CM

## 2012-06-08 MED ORDER — AZITHROMYCIN 250 MG PO TABS *I*
ORAL_TABLET | ORAL | Status: AC
Start: 2012-06-08 — End: 2012-06-13

## 2012-06-08 NOTE — Progress Notes (Signed)
CC: URI    MEDICATIONS/CHART REVIEWED done at the time of visit    SUBJECTIVE:Patient states last Thursday, he started with a mild sore throat that got progressively worse with runny nose , congestion, cough, swollen glands, increased sore throat, and plugged ears.  Denies fever or chill.  He has been taking dayquil and nyquil.  Today feels the worst. The cough is at times productive.  Clear to light yellowish phlegm.  Patient states his blood sugars have been good.  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    OBJECTIVE:  GENERAL:  A&OX3  EARS:  TM'S intact, non-injected, canals clear bilaterally.  THROAT:  non-injected  NECK:   supple, no significant adenopathy  LUNGS:  CTA  HEART:  RRR, normal S1, S2 no murmur  SKIN:      no rashes    ASSESSMENT: Sinusitis    PLAN:  1.  Z-Pak UAD dispense #1 no refills  2.  Nasal saline as needed  3.  Drink plenty of fluids and rest  4.  If not better, worse call  Darcel Smalling, NP

## 2012-06-08 NOTE — Telephone Encounter (Signed)
Patient scheduled 10/15 at 2:40p with Lura.

## 2012-06-08 NOTE — Telephone Encounter (Signed)
Patient called and stated that he has been very sick since last Thursday and is getting worse and worse.  He would like to come in today for an appointment and asked for a call back at [phone].

## 2012-06-08 NOTE — Patient Instructions (Addendum)
Upper Respiratory Infection, Adult  ExitCare ImageAn upper respiratory infection (URI) is also sometimes known as the common cold. The upper respiratory tract includes the nose, sinuses, throat, trachea, and bronchi. Bronchi are the airways leading to the lungs. Most people improve within 1 week, but symptoms can last up to 2 weeks. A residual cough may last even longer.   CAUSES  Many different viruses can infect the tissues lining the upper respiratory tract. The tissues become irritated and inflamed and often become very moist. Mucus production is also common. A cold is contagious. You can easily spread the virus to others by oral contact. This includes kissing, sharing a glass, coughing, or sneezing. Touching your mouth or nose and then touching a surface, which is then touched by another person, can also spread the virus.  SYMPTOMS   Symptoms typically develop 1 to 3 days after you come in contact with a cold virus. Symptoms vary from person to person. They may include:   Runny nose.   Sneezing.   Nasal congestion.   Sinus irritation.   Sore throat.   Loss of voice (laryngitis).   Cough.   Fatigue.   Muscle aches.   Loss of appetite.   Headache.   Low-grade fever.  DIAGNOSIS   You might diagnose your own cold based on familiar symptoms, since most people get a cold 2 to 3 times a year. Your caregiver can confirm this based on your exam. Most importantly, your caregiver can check that your symptoms are not due to another disease such as strep throat, sinusitis, pneumonia, asthma, or epiglottitis. Blood tests, throat tests, and X-rays are not necessary to diagnose a common cold, but they may sometimes be helpful in excluding other more serious diseases. Your caregiver will decide if any further tests are required.  RISKS AND COMPLICATIONS   You may be at risk for a more severe case of the common cold if you smoke cigarettes, have chronic heart disease (such as heart failure) or lung disease (such as  asthma), or if you have a weakened immune system. The very young and very old are also at risk for more serious infections. Bacterial sinusitis, middle ear infections, and bacterial pneumonia can complicate the common cold. The common cold can worsen asthma and chronic obstructive pulmonary disease (COPD). Sometimes, these complications can require emergency medical care and may be life-threatening.  PREVENTION   The best way to protect against getting a cold is to practice good hygiene. Avoid oral or hand contact with people with cold symptoms. Wash your hands often if contact occurs. There is no clear evidence that vitamin C, vitamin E, echinacea, or exercise reduces the chance of developing a cold. However, it is always recommended to get plenty of rest and practice good nutrition.  TREATMENT   Treatment is directed at relieving symptoms. There is no cure. Antibiotics are not effective, because the infection is caused by a virus, not by bacteria. Treatment may include:   Increased fluid intake. Sports drinks offer valuable electrolytes, sugars, and fluids.   Breathing heated mist or steam (vaporizer or shower).   Eating chicken soup or other clear broths, and maintaining good nutrition.   Getting plenty of rest.   Using gargles or lozenges for comfort.   Controlling fevers with ibuprofen or acetaminophen as directed by your caregiver.   Increasing usage of your inhaler if you have asthma.  Zinc gel and zinc lozenges, taken in the first 24 hours of the common cold, can shorten   the duration and lessen the severity of symptoms. Pain medicines may help with fever, muscle aches, and throat pain. A variety of non-prescription medicines are available to treat congestion and runny nose. Your caregiver can make recommendations and may suggest nasal or lung inhalers for other symptoms.   HOME CARE INSTRUCTIONS    Only take over-the-counter or prescription medicines for pain, discomfort, or fever as directed by your  caregiver.   Use a warm mist humidifier or inhale steam from a shower to increase air moisture. This may keep secretions moist and make it easier to breathe.   Drink enough water and fluids to keep your urine clear or pale yellow.   Rest as needed.   Return to work when your temperature has returned to normal or as your caregiver advises. You may need to stay home longer to avoid infecting others. You can also use a face mask and careful hand washing to prevent spread of the virus.  SEEK MEDICAL CARE IF:    After the first few days, you feel you are getting worse rather than better.   You need your caregiver's advice about medicines to control symptoms.   You develop chills, worsening shortness of breath, or brown or red sputum. These may be signs of pneumonia.   You develop yellow or brown nasal discharge or pain in the face, especially when you bend forward. These may be signs of sinusitis.   You develop a fever, swollen neck glands, pain with swallowing, or white areas in the back of your throat. These may be signs of strep throat.  SEEK IMMEDIATE MEDICAL CARE IF:    You have a fever.   You develop severe or persistent headache, ear pain, sinus pain, or chest pain.   You develop wheezing, a prolonged cough, cough up blood, or have a change in your usual mucus (if you have chronic lung disease).   You develop sore muscles or a stiff neck.  Document Released: 02/04/2001 Document Revised: 11/03/2011 Document Reviewed: 12/13/2010  ExitCare Patient Information 2013 [employer].

## 2012-06-16 ENCOUNTER — Other Ambulatory Visit: Payer: Self-pay | Admitting: Internal Medicine

## 2012-06-16 MED ORDER — DOXYCYCLINE MONOHYDRATE 100 MG PO CAPS *I*
100.0000 mg | ORAL_CAPSULE | Freq: Two times a day (BID) | ORAL | Status: AC
Start: 2012-06-16 — End: 2012-06-26

## 2012-06-24 ENCOUNTER — Other Ambulatory Visit: Payer: Self-pay | Admitting: Primary Care

## 2012-06-24 NOTE — Telephone Encounter (Signed)
Rx request routed to provider on 06/24/2012 at 9:16 AM

## 2012-08-12 ENCOUNTER — Encounter: Payer: Self-pay | Admitting: Internal Medicine

## 2012-08-12 ENCOUNTER — Telehealth: Payer: Self-pay | Admitting: Internal Medicine

## 2012-08-12 NOTE — Telephone Encounter (Signed)
Patient has to work tomorrow & will need to reschedule, [phone].

## 2012-08-13 ENCOUNTER — Ambulatory Visit: Payer: Self-pay | Admitting: Internal Medicine

## 2012-08-13 NOTE — Telephone Encounter (Signed)
I have attempted to contact this patient by phone with the following results: left message to return my call on answering machine.

## 2012-09-01 ENCOUNTER — Encounter: Payer: Self-pay | Admitting: Internal Medicine

## 2012-09-01 ENCOUNTER — Ambulatory Visit: Payer: Self-pay | Admitting: Internal Medicine

## 2012-11-26 ENCOUNTER — Other Ambulatory Visit: Payer: Self-pay | Admitting: Internal Medicine

## 2012-11-26 NOTE — Telephone Encounter (Signed)
Rx request routed to provider on 11/26/2012 at 8:48 AM

## 2012-12-24 ENCOUNTER — Other Ambulatory Visit: Payer: Self-pay | Admitting: Internal Medicine

## 2012-12-24 ENCOUNTER — Telehealth: Payer: Self-pay | Admitting: Internal Medicine

## 2012-12-24 NOTE — Telephone Encounter (Signed)
Pt called in stating that he just has not been feeling right lately. Pt states that he thinks it has something to do with his diabetes and would like to see Dr Gerlene Burdock on Monday. I made an appt for 5.7.14 @ 3:30 but he would like a phone call back with an appt for Monday.

## 2012-12-24 NOTE — Telephone Encounter (Signed)
Called the pt and left message that Dr Gerlene Burdock does not have appt on Monday, but another Provider could see him.  Asked the pt to call early 12/27/12 to make a plan

## 2012-12-24 NOTE — Telephone Encounter (Signed)
Medication request routed to Dr Gerlene Burdock for processing 12/24/2012 8:46 AM

## 2012-12-28 ENCOUNTER — Encounter: Payer: Self-pay | Admitting: Internal Medicine

## 2012-12-28 NOTE — Telephone Encounter (Signed)
Discussed with Dr Gerlene Burdock and pt has an appt on 12/29/12.

## 2012-12-29 ENCOUNTER — Ambulatory Visit: Payer: Self-pay | Admitting: Internal Medicine

## 2013-01-13 ENCOUNTER — Telehealth: Payer: Self-pay | Admitting: Internal Medicine

## 2013-01-13 NOTE — Telephone Encounter (Signed)
The patient has diabetes and would like to schedule an appointment. Please call the patient at the number provided.

## 2013-01-14 NOTE — Telephone Encounter (Signed)
Called patient, left vm for pt to call back and schedule appt.

## 2013-01-18 ENCOUNTER — Encounter: Payer: Self-pay | Admitting: Internal Medicine

## 2013-01-19 ENCOUNTER — Ambulatory Visit: Payer: Self-pay | Admitting: Internal Medicine

## 2013-01-19 ENCOUNTER — Encounter: Payer: Self-pay | Admitting: Internal Medicine

## 2013-01-19 VITALS — BP 114/80 | HR 80 | Temp 96.6°F | Ht 69.0 in | Wt 212.3 lb

## 2013-01-19 DIAGNOSIS — E119 Type 2 diabetes mellitus without complications: Secondary | ICD-10-CM

## 2013-01-19 LAB — LIPID PANEL
Chol/HDL Ratio: 4.3
Cholesterol: 186 mg/dL
HDL: 43 mg/dL
LDL Calculated: 98 mg/dL
Non HDL Cholesterol: 143 mg/dL
Triglycerides: 226 mg/dL — AB

## 2013-01-19 LAB — COMPREHENSIVE METABOLIC PANEL
ALT: 41 U/L (ref 0–50)
AST: 34 U/L (ref 0–50)
Albumin: 5 g/dL (ref 3.5–5.2)
Alk Phos: 31 U/L — ABNORMAL LOW (ref 40–130)
Anion Gap: 12 (ref 7–16)
Bilirubin,Total: 0.5 mg/dL (ref 0.0–1.2)
CO2: 24 mmol/L (ref 20–28)
Calcium: 9.2 mg/dL (ref 9.0–10.3)
Chloride: 101 mmol/L (ref 96–108)
Creatinine: 0.87 mg/dL (ref 0.67–1.17)
GFR,Black: 126 *
GFR,Caucasian: 109 *
Glucose: 109 mg/dL — ABNORMAL HIGH (ref 60–99)
Lab: 17 mg/dL (ref 6–20)
Potassium: 3.9 mmol/L (ref 3.3–5.1)
Sodium: 137 mmol/L (ref 133–145)
Total Protein: 7.6 g/dL (ref 6.3–7.7)

## 2013-01-20 LAB — MICROALBUMIN, URINE, RANDOM
Creatinine,UR: 259 mg/dL (ref 20–300)
Microalb/Creat Ratio: 5.6 mg MA/g CR (ref 0.0–29.9)
Microalbumin,UR: 1.44 mg/dL

## 2013-01-20 LAB — HEMOGLOBIN A1C: Hemoglobin A1C: 7.3 % — ABNORMAL HIGH (ref 4.0–6.0)

## 2013-01-21 ENCOUNTER — Encounter: Payer: Self-pay | Admitting: Internal Medicine

## 2013-01-21 NOTE — Progress Notes (Signed)
Subjective:     Patient ID: Dale Schultz is a 37 y.o. male.    HPI diabetes mellitus type 2, hyperlipidemia, anxiety disorder.    Overall, things have been very stressful.  His wife has had some recent health concerns.  Things have been very busy at work.    He does realize he needs to take his diabetes seriously.  His diet has not been good around the time of his wife's illness but it is getting better.  He also not been exercising much but he starting to get back on track.  In particular, he likes to ride a bike.    He has not checked his sugars much.  He was 227 after a meal the other day.    He thinks his feet are fine.  He denies any foot problems or skin breakdown.  He has not seen an eye doctor recently.  He does keep up with a dentist.    He would like to have some blood work done today to assess his diabetes status.  Of note, had not seen him since 2013.  He thinks his weight is roughly stable.    No changes in bowel or bladder habits.  Review of systems otherwise unremarkable.    On physical exam, no acute distress.  Anicteric not pale.  Conjunctiva not injected.  Throat without incident or erythema.  No cervical lymph nodes.  Neck supple.  Chest is clear to auscultation without crackles or wheeze.  CV regular rate and rhythm, S1, S2 without murmur gallop.  Extremities are without edema.    Impression.    Diabetes mellitus type 2.  Not ideally controlled in the past.  This is associated with hyperlipidemia.  Will check blood work today including a hemoglobin A1c, renal function, lipids, urine microalbumin.  We'll try to stay on track with a good diet and exercise program.  He'll continue with his metformin for now.  No need for insulin.  I'll see him back in a few months.    [name] has Irritable Bowel Syndrome; Nephrolithiasis; Hyperlipidemia; and Type II Diabetes Mellitus on his problem list.  Current Outpatient Prescriptions on File Prior to Visit   Medication Sig Dispense Refill   . metFORMIN  (GLUCOPHAGE) 1000 MG tablet TAKE 1 TABLET BY MOUTH TWO TIMES DAILY WITH MEALS  60 tablet  5   . citalopram (CELEXA) 20 MG tablet TAKE 1 TABLET BY MOUTH ONCE DAILY  30 tablet  5   . BAYER CONTOUR test strip tes 2-3 times daily as needed for 250.02  100 each  5   . LANCETS ULTRA THIN MISC Test 3 times daily for 250.02  100 each  5   . triamcinolone (KENALOG) 0.1 % cream Apply topically 2 times daily  30 g  1     No current facility-administered medications on file prior to visit.     Dale Schultz is allergic to penicillins.    Review of Systems  Physical Exam

## 2013-05-19 ENCOUNTER — Encounter: Payer: Self-pay | Admitting: Internal Medicine

## 2013-05-20 ENCOUNTER — Ambulatory Visit: Payer: Self-pay | Admitting: Internal Medicine

## 2013-05-20 ENCOUNTER — Encounter: Payer: Self-pay | Admitting: Internal Medicine

## 2013-05-20 VITALS — BP 118/76 | HR 72 | Wt 218.0 lb

## 2013-05-20 DIAGNOSIS — E119 Type 2 diabetes mellitus without complications: Secondary | ICD-10-CM

## 2013-05-20 LAB — BASIC METABOLIC PANEL
Anion Gap: 10 (ref 7–16)
CO2: 27 mmol/L (ref 20–28)
Calcium: 9.8 mg/dL (ref 9.0–10.3)
Chloride: 100 mmol/L (ref 96–108)
Creatinine: 0.76 mg/dL (ref 0.67–1.17)
GFR,Black: 133 *
GFR,Caucasian: 115 *
Glucose: 164 mg/dL — ABNORMAL HIGH (ref 60–99)
Lab: 15 mg/dL (ref 6–20)
Potassium: 4 mmol/L (ref 3.3–5.1)
Sodium: 137 mmol/L (ref 133–145)

## 2013-05-20 LAB — HEMOGLOBIN A1C: Hemoglobin A1C: 8.5 % — ABNORMAL HIGH (ref 4.0–6.0)

## 2013-05-20 NOTE — Progress Notes (Signed)
Subjective:     Patient ID: Dale Schultz is a 37 y.o. male.    HPI diabetes mellitus.    His sugars have been okay when he's checked it.  He denies any symptomatic lows.  He does not check a regular basis anymore.  He is tolerating his metformin but does have some loose stools with that.  However, it is manageable and he typically goes once a day.  He thinks his diet has been good but he just needs to exercise more.    He is busy at work and as well as home.    Urine flow is fine without changes.  Bowel movements are fine without blood.    He's due to get a flu vaccine at work.  He did see an eye doctor recently.    On physical exam, no acute distress.  He is anicteric not pale.  Throat without exudate or erythema.  Nose lymph nodes.  Chest is clear to auscultation without crackles or wheeze.  CV regular in rhythm, S1, S2 without murmur gallop.  Extremities are without edema.    Impression.    Diabetes mellitus type 2.  Recent A1c was 7.3.  We will advise diet and exercise in addition to metformin.  He needs to lose some weight.  I will check repeat labs today.  Follow up in 4 months.  We'll get a flu shot at work.  Ophthalmology exam up-to-date.    Dale Schultz has Irritable Bowel Syndrome; Nephrolithiasis; Hyperlipidemia; and Type II Diabetes Mellitus on his problem list.  Current Outpatient Prescriptions on File Prior to Visit   Medication Sig Dispense Refill   . metFORMIN (GLUCOPHAGE) 1000 MG tablet TAKE 1 TABLET BY MOUTH TWO TIMES DAILY WITH MEALS  60 tablet  5   . citalopram (CELEXA) 20 MG tablet TAKE 1 TABLET BY MOUTH ONCE DAILY  30 tablet  5   . BAYER CONTOUR test strip tes 2-3 times daily as needed for 250.02  100 each  5   . LANCETS ULTRA THIN MISC Test 3 times daily for 250.02  100 each  5   . triamcinolone (KENALOG) 0.1 % cream Apply topically 2 times daily  30 g  1     No current facility-administered medications on file prior to visit.     Kazi is allergic to penicillins.    Review of Systems  Physical  Exam

## 2013-05-28 ENCOUNTER — Other Ambulatory Visit: Payer: Self-pay | Admitting: Internal Medicine

## 2013-06-27 ENCOUNTER — Other Ambulatory Visit: Payer: Self-pay | Admitting: Internal Medicine

## 2013-06-27 NOTE — Telephone Encounter (Signed)
Medication request routed to MD for processing 06/27/2013 8:46 AM.

## 2013-09-22 ENCOUNTER — Encounter: Payer: Self-pay | Admitting: Internal Medicine

## 2013-09-22 ENCOUNTER — Ambulatory Visit: Payer: Self-pay | Admitting: Internal Medicine

## 2013-09-22 VITALS — BP 130/88 | HR 84 | Temp 98.6°F | Ht 69.0 in | Wt 220.7 lb

## 2013-09-22 DIAGNOSIS — E119 Type 2 diabetes mellitus without complications: Secondary | ICD-10-CM

## 2013-09-22 LAB — BASIC METABOLIC PANEL
Anion Gap: 17 — ABNORMAL HIGH (ref 7–16)
CO2: 26 mmol/L (ref 20–28)
Calcium: 9.4 mg/dL (ref 9.0–10.3)
Chloride: 98 mmol/L (ref 96–108)
Creatinine: 0.79 mg/dL (ref 0.67–1.17)
GFR,Black: 130 *
GFR,Caucasian: 113 *
Glucose: 137 mg/dL — ABNORMAL HIGH (ref 60–99)
Lab: 15 mg/dL (ref 6–20)
Potassium: 4.3 mmol/L (ref 3.3–5.1)
Sodium: 141 mmol/L (ref 133–145)

## 2013-09-22 NOTE — Progress Notes (Signed)
Subjective:     Patient ID: Dale Schultz is a 38 y.o. male.    HPI diabetes mellitus type 2.    He does not check his sugars.  He is a little disappointed his weight is up a few pounds despite having a very meticulous diet.  He will have bran flakes with blueberries for breakfast, granola, yogurt, fruit for lunch.  He does not eat a large dinner but does eat late in the day.  He has not had time for exercise as he is quite busy with work.  We did discuss stress reduction.    He thinks he had a recent cold with some nasal/sinus congestion but that is improving.  He is exposed to a lot of illnesses at work as well as he has 2 younger children.    Bowel movements are somewhat loose on metformin.  He is able to tolerate this.  He did inquire about other medications that he sees advertised on television.    He denies any chest pain, shortness of breath, edema, palpitations.  Urine flow is been fine.    On physical exam, no acute distress.  Head and neck exam is unremarkable.  Chest is clear to auscultation without crackles or wheeze.  CV regular in rhythm, S1, S2 without murmur gallop.  Extremities are without edema.    Impression.    Diabetes type 2.  Not well-controlled last hemoglobin A1c checked.  We will repeat today.  I advised eating dinner earlier and also getting back on his exercise program.  I'll see him back in 4 months.  We might need to add another agent to the metformin.  Of note, he was on Lantus in the past.    Dale Schultz has Irritable Bowel Syndrome; Nephrolithiasis; Hyperlipidemia; and Type II Diabetes Mellitus on his problem list.  Current Outpatient Prescriptions on File Prior to Visit   Medication Sig Dispense Refill    metFORMIN (GLUCOPHAGE) 1000 MG tablet TAKE 1 TABLET BY MOUTH TWO TIMES DAILY WITH MEALS  60 tablet  5    citalopram (CELEXA) 20 MG tablet TAKE 1 TABLET BY MOUTH ONE TIME DAILY  30 tablet  5    BAYER CONTOUR test strip tes 2-3 times daily as needed for 250.02  100 each  5    LANCETS  ULTRA THIN MISC Test 3 times daily for 250.02  100 each  5    triamcinolone (KENALOG) 0.1 % cream Apply topically 2 times daily  30 g  1     No current facility-administered medications on file prior to visit.     Dale Schultz is allergic to penicillins.    Review of Systems  Physical Exam

## 2013-09-23 ENCOUNTER — Encounter: Payer: Self-pay | Admitting: Internal Medicine

## 2013-09-23 LAB — HEMOGLOBIN A1C: Hemoglobin A1C: 8.8 % — ABNORMAL HIGH (ref 4.0–6.0)

## 2013-09-23 MED ORDER — GLIPIZIDE 5 MG PO TB24 *I*
5.0000 mg | ORAL_TABLET | Freq: Every day | ORAL | Status: DC
Start: 2013-09-23 — End: 2014-03-31

## 2013-11-27 ENCOUNTER — Other Ambulatory Visit: Payer: Self-pay | Admitting: Internal Medicine

## 2013-11-28 NOTE — Telephone Encounter (Signed)
Rx request routed to provider on 11/28/2013 at 8:12 AM

## 2013-12-25 ENCOUNTER — Other Ambulatory Visit: Payer: Self-pay | Admitting: Internal Medicine

## 2013-12-26 NOTE — Telephone Encounter (Signed)
Rx request routed to provider on 12/26/2013 at 8:18 AM

## 2014-01-17 ENCOUNTER — Encounter: Payer: Self-pay | Admitting: Internal Medicine

## 2014-01-17 MED ORDER — DOXYCYCLINE MONOHYDRATE 100 MG PO CAPS *I*
100.0000 mg | ORAL_CAPSULE | Freq: Two times a day (BID) | ORAL | Status: AC
Start: 2014-01-17 — End: 2014-01-27

## 2014-01-23 ENCOUNTER — Encounter: Payer: Self-pay | Admitting: Internal Medicine

## 2014-01-24 ENCOUNTER — Other Ambulatory Visit: Payer: Self-pay | Admitting: Internal Medicine

## 2014-01-24 ENCOUNTER — Ambulatory Visit: Payer: Self-pay | Admitting: Internal Medicine

## 2014-01-24 ENCOUNTER — Ambulatory Visit
Admit: 2014-01-24 | Discharge: 2014-01-24 | Disposition: A | Discharge status: Home / Self Care | Payer: Self-pay | Source: Ambulatory Visit | Attending: Internal Medicine | Admitting: Internal Medicine

## 2014-01-24 DIAGNOSIS — E119 Type 2 diabetes mellitus without complications: Secondary | ICD-10-CM

## 2014-01-24 LAB — COMPREHENSIVE METABOLIC PANEL
ALT: 23 U/L (ref 0–50)
AST: 24 U/L (ref 0–50)
Albumin: 4.7 g/dL (ref 3.5–5.2)
Alk Phos: 33 U/L — ABNORMAL LOW (ref 40–130)
Anion Gap: 17 — ABNORMAL HIGH (ref 7–16)
Bilirubin,Total: 0.3 mg/dL (ref 0.0–1.2)
CO2: 24 mmol/L (ref 20–28)
Calcium: 9.6 mg/dL (ref 9.0–10.3)
Chloride: 100 mmol/L (ref 96–108)
Creatinine: 0.79 mg/dL (ref 0.67–1.17)
GFR,Black: 130 *
GFR,Caucasian: 112 *
Glucose: 102 mg/dL — ABNORMAL HIGH (ref 60–99)
Lab: 17 mg/dL (ref 6–20)
Potassium: 4.5 mmol/L (ref 3.3–5.1)
Sodium: 141 mmol/L (ref 133–145)
Total Protein: 7.2 g/dL (ref 6.3–7.7)

## 2014-01-24 LAB — LIPID PANEL
Chol/HDL Ratio: 5.3
Cholesterol: 203 mg/dL — AB
HDL: 38 mg/dL
LDL Calculated: 103 mg/dL
Non HDL Cholesterol: 165 mg/dL
Triglycerides: 311 mg/dL — AB

## 2014-01-24 LAB — HEMOGLOBIN A1C: Hemoglobin A1C: 6.2 % — ABNORMAL HIGH (ref 4.0–6.0)

## 2014-01-25 LAB — MICROALBUMIN, URINE, RANDOM
Creatinine,UR: 65 mg/dL (ref 20–300)
Microalbumin,UR: <0.3 mg/dL

## 2014-03-01 ENCOUNTER — Encounter: Payer: Self-pay | Admitting: Internal Medicine

## 2014-03-01 ENCOUNTER — Ambulatory Visit: Payer: Self-pay | Admitting: Internal Medicine

## 2014-03-01 VITALS — BP 122/86 | HR 70 | Temp 97.0°F | Ht 69.02 in | Wt 213.0 lb

## 2014-03-01 DIAGNOSIS — E119 Type 2 diabetes mellitus without complications: Secondary | ICD-10-CM

## 2014-03-01 NOTE — Progress Notes (Signed)
Subjective:     Patient ID: Dale Schultz is a 38 y.o. male.    HPI diabetes mellitus.  Hyperlipidemia.    He is been trying to lose weight mainly with diet.  He is cut back on carbohydrates.  He is eating more fruits and vegetables.  He did feels somewhat different the first few days but now feels like he has more energy.  He has also been exercising mainly by riding his bike.  He is very pleased with the results of his hemoglobin A1c which dropped from 8.8 down to 6.2.   He is trying to get down to a weight of 200 pounds.  He is very motivated.  He does have the help of his wife as well.    He did see an eye doctor within the past year.  No problems with retinopathy.   I occasion, he will have a mild tingling in his feet but it does not last.  He does wear comfortable socks and shoes.  He has not noticed any problems with his feet.    His triglycerides have been somewhat high with slightly low HDL    He is tolerating his medications although he does have some GI upset metformin.  He still feels lose weight so he might be able to decrease the dose.    Urine flows been fine.  No changes in bowel habits on the metformin.  No blood in his stool.    He is still busy as a middle school principal.  He also is active with his family.    On physical exam, no acute distress.  Anicteric not pale.  Conjunctiva not injected.  Throat without exudate or erythema.  No cervical lymphadenopathy.  Chest is clear without crackles or wheeze.  CV is regular in rhythm, S1, S2 without murmur or gallop.  Extremities without edema.  Foot exam was benign.  2+ distal pulses.  No skin breakdown.  Sensory intact.  No ulcers.  No signs of fungal infection.  No signs of ingrown nails.    Impression.    Diabetes type 2, dyslipidemia.  He will continue with his metformin and glipizide.  Fortunately, he has lost weight and will continue to try to lose weight.  He likely would benefit from a statin at some point.  We will repeat blood work before his  next visit.  Followup in about 4 months.    Other issues are stable.    Dale Schultz has Irritable Bowel Syndrome; Nephrolithiasis; Hyperlipidemia; and Type II Diabetes Mellitus on his problem list.  Current Outpatient Prescriptions on File Prior to Visit   Medication Sig Dispense Refill    metFORMIN (GLUCOPHAGE) 1000 MG tablet TAKE 1 TABLET BY MOUTH TWO TIMES DAILY WITH MEALS  60 tablet  5    citalopram (CELEXA) 20 MG tablet TAKE 1 TABLET BY MOUTH ONE TIME DAILY  30 tablet  5    glipiZIDE (GLUCOTROL) 5 MG 24 hr tablet Take 1 tablet (5 mg total) by mouth daily (with breakfast)   Swallow whole. Do not crush, break, or chew.  30 tablet  5    BAYER CONTOUR test strip tes 2-3 times daily as needed for 250.02  100 each  5    LANCETS ULTRA THIN MISC Test 3 times daily for 250.02  100 each  5    triamcinolone (KENALOG) 0.1 % cream Apply topically 2 times daily  30 g  1     No current facility-administered medications on file prior  to visit.     Dale Schultz is allergic to penicillins.    Review of Systems  Physical Exam

## 2014-03-01 NOTE — Patient Instructions (Signed)
Please get blood work done before next visit.

## 2014-03-31 ENCOUNTER — Other Ambulatory Visit: Payer: Self-pay | Admitting: Internal Medicine

## 2014-03-31 MED ORDER — GLIPIZIDE 5 MG PO TB24 *I*
5.0000 mg | ORAL_TABLET | Freq: Every day | ORAL | Status: DC
Start: 2014-03-31 — End: 2014-09-25

## 2014-03-31 NOTE — Telephone Encounter (Signed)
Rx request routed to provider on 03/31/2014 at 3:52 PM

## 2014-06-05 ENCOUNTER — Other Ambulatory Visit: Payer: Self-pay | Admitting: Internal Medicine

## 2014-06-05 MED ORDER — CITALOPRAM HYDROBROMIDE 20 MG PO TABS *I*
20.0000 mg | ORAL_TABLET | Freq: Every day | ORAL | Status: DC
Start: 2014-06-05 — End: 2014-06-06

## 2014-06-05 NOTE — Telephone Encounter (Signed)
Medication request routed to MD for processing 06/05/2014 1:59 PM.

## 2014-06-06 ENCOUNTER — Other Ambulatory Visit: Payer: Self-pay | Admitting: Internal Medicine

## 2014-06-06 MED ORDER — CITALOPRAM HYDROBROMIDE 20 MG PO TABS *I*
20.0000 mg | ORAL_TABLET | Freq: Every day | ORAL | Status: DC
Start: 2014-06-06 — End: 2014-12-27

## 2014-06-06 NOTE — Telephone Encounter (Signed)
Requested Prescriptions     Pending Prescriptions Disp Refills    citalopram (CELEXA) 20 MG tablet 30 tablet 5     Sig: Take 1 tablet (20 mg total) by mouth daily

## 2014-06-29 ENCOUNTER — Other Ambulatory Visit: Payer: Self-pay | Admitting: Internal Medicine

## 2014-06-29 MED ORDER — METFORMIN HCL 1000 MG PO TABS *I*
1000.0000 mg | ORAL_TABLET | Freq: Two times a day (BID) | ORAL | Status: DC
Start: 2014-06-29 — End: 2014-12-27

## 2014-06-29 NOTE — Telephone Encounter (Signed)
Rx request routed to provider on 06/29/2014 at 2:43 PM

## 2014-06-30 ENCOUNTER — Ambulatory Visit
Admit: 2014-06-30 | Discharge: 2014-06-30 | Disposition: A | Discharge status: Home / Self Care | Payer: Self-pay | Source: Ambulatory Visit | Attending: Internal Medicine | Admitting: Internal Medicine

## 2014-06-30 DIAGNOSIS — E119 Type 2 diabetes mellitus without complications: Secondary | ICD-10-CM

## 2014-06-30 LAB — BASIC METABOLIC PANEL
Anion Gap: 15 (ref 7–16)
CO2: 24 mmol/L (ref 20–28)
Calcium: 8.8 mg/dL — ABNORMAL LOW (ref 9.0–10.3)
Chloride: 103 mmol/L (ref 96–108)
Creatinine: 0.9 mg/dL (ref 0.67–1.17)
GFR,Black: 123 *
GFR,Caucasian: 106 *
Glucose: 162 mg/dL — ABNORMAL HIGH (ref 60–99)
Lab: 14 mg/dL (ref 6–20)
Potassium: 4.3 mmol/L (ref 3.3–5.1)
Sodium: 142 mmol/L (ref 133–145)

## 2014-07-01 LAB — HEMOGLOBIN A1C: Hemoglobin A1C: 6.3 % — ABNORMAL HIGH (ref 4.0–6.0)

## 2014-07-03 ENCOUNTER — Encounter: Payer: Self-pay | Admitting: Internal Medicine

## 2014-07-04 ENCOUNTER — Ambulatory Visit: Payer: Self-pay | Admitting: Internal Medicine

## 2014-07-04 ENCOUNTER — Encounter: Payer: Self-pay | Admitting: Internal Medicine

## 2014-07-04 VITALS — BP 124/72 | HR 72 | Wt 225.0 lb

## 2014-07-04 DIAGNOSIS — E119 Type 2 diabetes mellitus without complications: Secondary | ICD-10-CM

## 2014-07-04 NOTE — Progress Notes (Signed)
Subjective:     Patient ID: Dale Schultz is a 38 y.o. male.    HPI diabetes mellitus.    Dale Schultz has been frustrated as Dale Schultz has not been able to lose weight.  Dale Schultz has not been perfect on his diet and Dale Schultz is eating a few more carbohydrates.  Dale Schultz also has not had a lot to exercise.  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  Dale Schultz also has 2 children ages 184 and 366.    Dale Schultz has noticed that his left middle finger sometimes is a little stiff but that is improving.    Dale Schultz has no changes in his bowel movements.  There on the looser side to 2 metformin.  Urine flow is fine.  Dale Schultz denies any chest pain, shortness of breath, palpitations, edema.    Review systems otherwise unremarkable.  Dale Schultz already received a flu shot.    On physical exam, no acute distress.  Head and neck exam is unremarkable.  Chest is clear to auscultation without crackles or wheeze.  CV is regular in rhythm, S1, S2 without murmur gallop.  Extremities are without edema.    impression.    Diabetes type 2.  Excellent hemoglobin A1c.  However, I am concerned about his recent weight gain.  We'll continue with metformin and glipizide.  Followup in 4 months for repeat labs.  Likely would benefit from a statin but we'll address next visit.  We'll continue to monitor blood pressure.  Stress diet and exercise.  Already had flu shot.    Dale Schultz has Irritable Bowel Syndrome; Nephrolithiasis; Hyperlipidemia; and Diabetes mellitus on his problem list.  Current Outpatient Prescriptions on File Prior to Visit   Medication Sig Dispense Refill    metFORMIN (GLUCOPHAGE) 1000 MG tablet Take 1 tablet (1,000 mg total) by mouth 2 times daily (with meals) 60 tablet 5    citalopram (CELEXA) 20 MG tablet Take 1 tablet (20 mg total) by mouth daily 30 tablet 5    glipiZIDE (GLUCOTROL) 5 MG 24 hr tablet Take 1 tablet (5 mg total) by mouth daily (with breakfast)   Swallow whole. Do not crush, break, or chew. 30 tablet 5    BAYER CONTOUR test strip tes 2-3 times daily as needed for  250.02 100 each 5    LANCETS ULTRA THIN MISC Test 3 times daily for 250.02 100 each 5    triamcinolone (KENALOG) 0.1 % cream Apply topically 2 times daily 30 g 1     No current facility-administered medications on file prior to visit.     Dale Schultz is allergic to penicillins.    Review of Systems  Physical Exam

## 2014-09-25 ENCOUNTER — Other Ambulatory Visit: Payer: Self-pay | Admitting: Internal Medicine

## 2014-09-25 MED ORDER — GLIPIZIDE 5 MG PO TB24 *I*
5.0000 mg | ORAL_TABLET | Freq: Every day | ORAL | Status: DC
Start: 2014-09-25 — End: 2015-03-26

## 2014-09-25 NOTE — Telephone Encounter (Signed)
Rx request routed to provider on 09/25/2014 at 10:48 AM

## 2014-11-06 ENCOUNTER — Encounter: Payer: Self-pay | Admitting: Internal Medicine

## 2014-11-07 ENCOUNTER — Ambulatory Visit: Payer: Self-pay | Admitting: Internal Medicine

## 2014-11-07 ENCOUNTER — Encounter: Payer: Self-pay | Admitting: Internal Medicine

## 2014-11-07 VITALS — BP 132/90 | HR 88 | Temp 96.4°F | Ht 69.02 in | Wt 220.0 lb

## 2014-11-07 DIAGNOSIS — E119 Type 2 diabetes mellitus without complications: Secondary | ICD-10-CM

## 2014-11-07 LAB — BASIC METABOLIC PANEL
Anion Gap: 18 — ABNORMAL HIGH (ref 7–16)
CO2: 24 mmol/L (ref 20–28)
Calcium: 9.4 mg/dL (ref 9.0–10.3)
Chloride: 98 mmol/L (ref 96–108)
Creatinine: 0.8 mg/dL (ref 0.67–1.17)
GFR,Black: 129 *
GFR,Caucasian: 111 *
Glucose: 100 mg/dL — ABNORMAL HIGH (ref 60–99)
Lab: 14 mg/dL (ref 6–20)
Potassium: 4.5 mmol/L (ref 3.3–5.1)
Sodium: 140 mmol/L (ref 133–145)

## 2014-11-07 NOTE — Progress Notes (Signed)
Subjective:     Patient ID: Dale Schultz is a 39 y.o. male.    HPI DM    He is frustrated that he has not lost more weight despite exercising.  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  Denies any dyspnea, CP, palpitations.  No changes in urine or BM's.    Does not push too many lquids despite H/O renal stones.  Advised to drink more, especially with exercise.    Sometimes eats more due to stress.  Ate very well yesterday but still had 2 bowls of cereal in evening.    ROS otherwise negative.    On exam, NAD.  HEENT unremarkable.  No nodes.  Chest CTA.  CV RRR, S1, S2 without gallop or murmur.  No edema.    DM - will check A1C on metformin (has some loose stools) and glipizide.  Needs lipids and urine next visit in 4 months.  Advised better diet, especially snacks at night.  Continue exercise.    Dale Schultz has Irritable Bowel Syndrome; Nephrolithiasis; Hyperlipidemia; and Diabetes mellitus on his problem list.  Current Outpatient Prescriptions on File Prior to Visit   Medication Sig Dispense Refill    glipiZIDE (GLUCOTROL) 5 MG 24 hr tablet Take 1 tablet (5 mg total) by mouth daily (with breakfast)   Swallow whole. Do not crush, break, or chew. 30 tablet 5    metFORMIN (GLUCOPHAGE) 1000 MG tablet Take 1 tablet (1,000 mg total) by mouth 2 times daily (with meals) 60 tablet 5    citalopram (CELEXA) 20 MG tablet Take 1 tablet (20 mg total) by mouth daily 30 tablet 5    BAYER CONTOUR test strip tes 2-3 times daily as needed for 250.02 100 each 5    LANCETS ULTRA THIN MISC Test 3 times daily for 250.02 100 each 5    triamcinolone (KENALOG) 0.1 % cream Apply topically 2 times daily 30 g 1     No current facility-administered medications on file prior to visit.     Dale Schultz is allergic to penicillins.    Review of Systems  Physical Exam

## 2014-11-08 LAB — HEMOGLOBIN A1C: Hemoglobin A1C: 6.8 % — ABNORMAL HIGH (ref 4.0–6.0)

## 2014-12-17 ENCOUNTER — Encounter: Payer: Self-pay | Admitting: Internal Medicine

## 2014-12-18 MED ORDER — DOXYCYCLINE MONOHYDRATE 100 MG PO CAPS *I*
100.0000 mg | ORAL_CAPSULE | Freq: Two times a day (BID) | ORAL | Status: AC
Start: 2014-12-18 — End: 2014-12-28

## 2014-12-27 ENCOUNTER — Other Ambulatory Visit: Payer: Self-pay | Admitting: Internal Medicine

## 2014-12-27 MED ORDER — METFORMIN HCL 1000 MG PO TABS *I*
1000.0000 mg | ORAL_TABLET | Freq: Two times a day (BID) | ORAL | Status: DC
Start: 2014-12-27 — End: 2015-06-26

## 2014-12-27 MED ORDER — CITALOPRAM HYDROBROMIDE 20 MG PO TABS *I*
20.0000 mg | ORAL_TABLET | Freq: Every day | ORAL | Status: DC
Start: 2014-12-27 — End: 2015-06-26

## 2014-12-27 NOTE — Telephone Encounter (Signed)
Rx request routed to provider on 12/27/2014 at 9:05 AM

## 2015-03-08 ENCOUNTER — Encounter: Payer: Self-pay | Admitting: Internal Medicine

## 2015-03-09 ENCOUNTER — Encounter: Payer: Self-pay | Admitting: Internal Medicine

## 2015-03-09 ENCOUNTER — Ambulatory Visit
Admit: 2015-03-09 | Discharge: 2015-03-09 | Disposition: A | Discharge status: Home / Self Care | Payer: Self-pay | Source: Ambulatory Visit | Attending: Internal Medicine | Admitting: Internal Medicine

## 2015-03-09 DIAGNOSIS — E119 Type 2 diabetes mellitus without complications: Secondary | ICD-10-CM

## 2015-03-09 LAB — BASIC METABOLIC PANEL
Anion Gap: 15 (ref 7–16)
CO2: 22 mmol/L (ref 20–28)
Calcium: 9.1 mg/dL (ref 9.0–10.3)
Chloride: 101 mmol/L (ref 96–108)
Creatinine: 0.76 mg/dL (ref 0.67–1.17)
GFR,Black: 131 *
GFR,Caucasian: 113 *
Glucose: 251 mg/dL — ABNORMAL HIGH (ref 60–99)
Lab: 15 mg/dL (ref 6–20)
Potassium: 4.4 mmol/L (ref 3.3–5.1)
Sodium: 138 mmol/L (ref 133–145)

## 2015-03-09 LAB — HEMOGLOBIN A1C: Hemoglobin A1C: 7.3 % — ABNORMAL HIGH (ref 4.0–6.0)

## 2015-03-10 NOTE — Progress Notes (Signed)
This encounter was created in error - please disregard.

## 2015-03-26 ENCOUNTER — Other Ambulatory Visit: Payer: Self-pay

## 2015-03-26 MED ORDER — GLIPIZIDE 5 MG PO TB24 *I*
5.0000 mg | ORAL_TABLET | Freq: Every day | ORAL | 5 refills | Status: DC
Start: 2015-03-26 — End: 2015-09-24

## 2015-03-26 NOTE — Telephone Encounter (Signed)
Rx request routed to provider on 03/26/2015 at 4:18 PM

## 2015-03-27 ENCOUNTER — Encounter: Payer: Self-pay | Admitting: Internal Medicine

## 2015-03-28 MED ORDER — NYSTATIN 100000 UNIT/GM EX CREA *I*
TOPICAL_CREAM | Freq: Two times a day (BID) | CUTANEOUS | 1 refills | Status: DC
Start: 2015-03-28 — End: 2016-09-05

## 2015-05-14 ENCOUNTER — Encounter: Payer: Self-pay | Admitting: Internal Medicine

## 2015-05-15 ENCOUNTER — Ambulatory Visit: Payer: Self-pay | Admitting: Internal Medicine

## 2015-05-15 ENCOUNTER — Encounter: Payer: Self-pay | Admitting: Internal Medicine

## 2015-05-15 VITALS — BP 144/88 | HR 80 | Temp 97.2°F | Ht 69.0 in | Wt 218.0 lb

## 2015-05-15 DIAGNOSIS — E119 Type 2 diabetes mellitus without complications: Secondary | ICD-10-CM

## 2015-05-15 DIAGNOSIS — E785 Hyperlipidemia, unspecified: Secondary | ICD-10-CM

## 2015-05-15 NOTE — Patient Instructions (Signed)
Please get blood work sometime in October.

## 2015-05-15 NOTE — Progress Notes (Signed)
Subjective:     Patient ID: Dale Schultz is a 39 y.o. male.    HPI diabetes, hyperlipidemia, slightly elevated blood pressure.    When he first came in, his blood pressure was a little elevated  As he was rushing here.  Repeat was fine.  He denies any chest pain, shows of breath, edema, palpitations.    He already received a flu shot this year.  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  He is busy throughout the year including the summertime.  He does not have time to exercise but is trying to be more With his diet and has cut back on carbohydrates.    His last hemoglobin A1c was 7.3.  He did lose a few pounds and his clothes fit a little better.  He does have 2 children and he is very active with them including assisting coach on a crusting.    On physical exam, no acute distress.  Head and neck exam is unremarkable.  Chest is clear to auscultation without crackles or wheeze.  CV regular rate and rhythm, S1, S2 without murmur or gallop.  Extremities are without edema.    Impression.    Diabetes mellitus.  Would like to see the hemoglobin A1c less than 7 given his age and comorbid illnesses.  I advised an exercise program.  He will continue with metformin and glipizide for now.  Repeat blood work in a few months.  I'll see him back in 4 months.    Elevated blood pressure.  We will monitor this.  He would be a good candidate for ACE inhibitor if needed.  Will check urine microalbumin.    Hyperlipidemia.  We will check a lipid profile.    Dale Schultz has Irritable Bowel Syndrome; Nephrolithiasis; Hyperlipidemia; and Diabetes mellitus on his problem list.  Current Outpatient Prescriptions on File Prior to Visit   Medication Sig Dispense Refill    nystatin (MYCOSTATIN) cream Apply topically 2 times daily   For 2-3 weeks 30 g 1    glipiZIDE (GLUCOTROL) 5 MG 24 hr tablet Take 1 tablet (5 mg total) by mouth daily (with breakfast)   Swallow whole. Do not crush, break, or chew. 30 tablet 5     citalopram (CELEXA) 20 MG tablet Take 1 tablet (20 mg total) by mouth daily 30 tablet 5    metFORMIN (GLUCOPHAGE) 1000 MG tablet Take 1 tablet (1,000 mg total) by mouth 2 times daily (with meals) 60 tablet 5    BAYER CONTOUR test strip tes 2-3 times daily as needed for 250.02 100 each 5    LANCETS ULTRA THIN MISC Test 3 times daily for 250.02 100 each 5    triamcinolone (KENALOG) 0.1 % cream Apply topically 2 times daily 30 g 1     No current facility-administered medications on file prior to visit.      Dale Schultz is allergic to penicillins.    Review of Systems   Physical Exam

## 2015-06-26 ENCOUNTER — Other Ambulatory Visit: Payer: Self-pay

## 2015-06-26 MED ORDER — CITALOPRAM HYDROBROMIDE 20 MG PO TABS *I*
20.0000 mg | ORAL_TABLET | Freq: Every day | ORAL | 5 refills | Status: DC
Start: 2015-06-26 — End: 2015-12-24

## 2015-06-26 MED ORDER — METFORMIN HCL 1000 MG PO TABS *I*
1000.0000 mg | ORAL_TABLET | Freq: Two times a day (BID) | ORAL | 5 refills | Status: DC
Start: 2015-06-26 — End: 2015-12-24

## 2015-06-26 NOTE — Telephone Encounter (Signed)
Rx request routed to provider on 06/26/2015 at 4:28 PM

## 2015-08-16 ENCOUNTER — Encounter: Payer: Self-pay | Admitting: Internal Medicine

## 2015-08-16 MED ORDER — DOXYCYCLINE MONOHYDRATE 100 MG PO CAPS *I*
100.0000 mg | ORAL_CAPSULE | Freq: Two times a day (BID) | ORAL | 0 refills | Status: AC
Start: 2015-08-16 — End: 2015-08-23

## 2015-09-18 ENCOUNTER — Ambulatory Visit: Payer: Self-pay | Admitting: Internal Medicine

## 2015-09-24 ENCOUNTER — Other Ambulatory Visit: Payer: Self-pay | Admitting: Internal Medicine

## 2015-09-24 MED ORDER — GLIPIZIDE 5 MG PO TB24 *I*
5.0000 mg | ORAL_TABLET | Freq: Every day | ORAL | 5 refills | Status: DC
Start: 2015-09-24 — End: 2016-03-27

## 2015-09-24 NOTE — Telephone Encounter (Signed)
Rx request routed to provider on 09/24/2015 at 9:51 AM

## 2015-10-23 ENCOUNTER — Encounter: Payer: Self-pay | Admitting: Internal Medicine

## 2015-10-23 ENCOUNTER — Ambulatory Visit: Payer: Self-pay | Admitting: Internal Medicine

## 2015-10-23 VITALS — BP 122/88 | HR 80 | Temp 97.7°F | Ht 69.02 in | Wt 225.0 lb

## 2015-10-23 DIAGNOSIS — E119 Type 2 diabetes mellitus without complications: Secondary | ICD-10-CM

## 2015-10-23 DIAGNOSIS — E7801 Familial hypercholesterolemia: Secondary | ICD-10-CM

## 2015-10-23 LAB — COMPREHENSIVE METABOLIC PANEL
ALT: 56 U/L — ABNORMAL HIGH (ref 0–50)
AST: 40 U/L (ref 0–50)
Albumin: 4.6 g/dL (ref 3.5–5.2)
Alk Phos: 36 U/L — ABNORMAL LOW (ref 40–130)
Anion Gap: 15 (ref 7–16)
Bilirubin,Total: 0.3 mg/dL (ref 0.0–1.2)
CO2: 28 mmol/L (ref 20–28)
Calcium: 9.5 mg/dL (ref 9.0–10.3)
Chloride: 96 mmol/L (ref 96–108)
Creatinine: 0.9 mg/dL (ref 0.67–1.17)
GFR,Black: 122 *
GFR,Caucasian: 105 *
Glucose: 149 mg/dL — ABNORMAL HIGH (ref 60–99)
Lab: 14 mg/dL (ref 6–20)
Potassium: 4.4 mmol/L (ref 3.3–5.1)
Sodium: 139 mmol/L (ref 133–145)
Total Protein: 7.3 g/dL (ref 6.3–7.7)

## 2015-10-23 LAB — LIPID PANEL
Chol/HDL Ratio: 7.4
Cholesterol: 243 mg/dL — AB
HDL: 33 mg/dL
Non HDL Cholesterol: 210 mg/dL
Triglycerides: 708 mg/dL — AB

## 2015-10-23 LAB — LDL CHOLESTEROL, DIRECT: LDL Direct: 110 mg/dL

## 2015-10-23 NOTE — Progress Notes (Signed)
Subjective:     Patient ID: Dale Schultz is a 40 y.o. male.    HPI DM, lipids, anxiety.    Overall, under stress from work but doing well.  Able to exercise every other day - either bike or running.  No CP, DOE, edema, palpitations.    No changes in bowel or bladder habits.  Has some loose stools and occasional urgency on metformin.  no blood.  Tolerates meds otherwise.    considering about stopping citalopram but he realizes helps his mood and anxiety.  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      Weight stable.  Eats well at meals but snacks at night.  ROS negative otherwise except some recent colds.    On exam, NAD.  Anicteric not pale.  Throat without exudate or erythema.  No oral lesions.  No nodes.  Chest CTA without wheeze or crackles.  CV RRR, S1, S2 without murmur or gallop.  No edema.  Feet with 2+ pulses, intact sensation, no skin break down.  No tinea.     Impression.    DM - doing well with exercise but needs to control snacks.  Weight loss.  Feet are fine and sees eye MD yearly.  Will check labs.  4 month F/U.    Hyperlipidemia - will improve with improved DM.  Consider statin given increased vascular risk.  In past, has wanted to hold off.    Anxiety - well controlled on citalopram.  Consider taper over summer - revisit next visit in 4 months.    Dayn has Irritable Bowel Syndrome; Nephrolithiasis; Hyperlipidemia; and Diabetes mellitus on his problem list.  Current Outpatient Prescriptions on File Prior to Visit   Medication Sig Dispense Refill    glipiZIDE (GLUCOTROL) 5 MG 24 hr tablet Take 1 tablet (5 mg total) by mouth daily (with breakfast)   Swallow whole. Do not crush, break, or chew. 30 tablet 5    citalopram (CELEXA) 20 MG tablet Take 1 tablet (20 mg total) by mouth daily 30 tablet 5    metFORMIN (GLUCOPHAGE) 1000 MG tablet Take 1 tablet (1,000 mg total) by mouth 2 times daily (with meals) 60 tablet 5    nystatin (MYCOSTATIN) cream Apply topically 2 times daily   For 2-3 weeks  30 g 1    BAYER CONTOUR test strip tes 2-3 times daily as needed for 250.02 100 each 5    LANCETS ULTRA THIN MISC Test 3 times daily for 250.02 100 each 5    triamcinolone (KENALOG) 0.1 % cream Apply topically 2 times daily 30 g 1     No current facility-administered medications on file prior to visit.      Kaulana is allergic to penicillins.    Review of Systems  Physical Exam

## 2015-10-23 NOTE — Patient Instructions (Signed)
We can talk about citalopram at next visit.

## 2015-10-24 LAB — MICROALBUMIN, URINE, RANDOM
Creatinine,UR: 168 mg/dL (ref 20–300)
Microalb/Creat Ratio: 4.8 mg MA/g CR (ref 0.0–29.9)
Microalbumin,UR: 0.81 mg/dL

## 2015-10-24 LAB — HEMOGLOBIN A1C: Hemoglobin A1C: 8 % — ABNORMAL HIGH (ref 4.0–6.0)

## 2015-11-26 ENCOUNTER — Encounter: Payer: Self-pay | Admitting: Internal Medicine

## 2015-12-24 ENCOUNTER — Other Ambulatory Visit: Payer: Self-pay

## 2015-12-24 MED ORDER — METFORMIN HCL 1000 MG PO TABS *I*
1000.0000 mg | ORAL_TABLET | Freq: Two times a day (BID) | ORAL | 5 refills | Status: DC
Start: 2015-12-24 — End: 2016-06-23

## 2015-12-24 MED ORDER — CITALOPRAM HYDROBROMIDE 20 MG PO TABS *I*
20.0000 mg | ORAL_TABLET | Freq: Every day | ORAL | 5 refills | Status: DC
Start: 2015-12-24 — End: 2016-06-23

## 2015-12-24 NOTE — Telephone Encounter (Signed)
Rx request routed to provider on 12/24/2015 at 3:44 PM

## 2016-01-27 ENCOUNTER — Encounter: Payer: Self-pay | Admitting: Internal Medicine

## 2016-01-28 MED ORDER — PREDNISONE 20 MG PO TABS *I*
ORAL_TABLET | ORAL | 0 refills | Status: DC
Start: 2016-01-28 — End: 2016-02-21

## 2016-02-07 ENCOUNTER — Encounter: Payer: Self-pay | Admitting: Internal Medicine

## 2016-02-22 ENCOUNTER — Ambulatory Visit: Payer: Self-pay | Admitting: Internal Medicine

## 2016-02-22 ENCOUNTER — Other Ambulatory Visit
Admission: RE | Admit: 2016-02-22 | Discharge: 2016-02-22 | Disposition: A | Discharge status: Home / Self Care | Payer: Self-pay | Source: Ambulatory Visit | Attending: Internal Medicine | Admitting: Internal Medicine

## 2016-02-22 DIAGNOSIS — E119 Type 2 diabetes mellitus without complications: Secondary | ICD-10-CM

## 2016-02-22 DIAGNOSIS — E785 Hyperlipidemia, unspecified: Secondary | ICD-10-CM

## 2016-02-22 LAB — LIPID PANEL
Chol/HDL Ratio: 6.2
Cholesterol: 228 mg/dL — AB
HDL: 37 mg/dL
LDL Calculated: 111 mg/dL
Non HDL Cholesterol: 191 mg/dL
Triglycerides: 398 mg/dL — AB

## 2016-02-22 LAB — COMPREHENSIVE METABOLIC PANEL
ALT: 49 U/L (ref 0–50)
AST: 30 U/L (ref 0–50)
Albumin: 4.7 g/dL (ref 3.5–5.2)
Alk Phos: 31 U/L — ABNORMAL LOW (ref 40–130)
Anion Gap: 16 (ref 7–16)
Bilirubin,Total: 0.2 mg/dL (ref 0.0–1.2)
CO2: 24 mmol/L (ref 20–28)
Calcium: 9.5 mg/dL (ref 9.0–10.3)
Chloride: 99 mmol/L (ref 96–108)
Creatinine: 0.8 mg/dL (ref 0.67–1.17)
GFR,Black: 127 *
GFR,Caucasian: 110 *
Glucose: 222 mg/dL — ABNORMAL HIGH (ref 60–99)
Lab: 14 mg/dL (ref 6–20)
Potassium: 4.6 mmol/L (ref 3.3–5.1)
Sodium: 139 mmol/L (ref 133–145)
Total Protein: 7 g/dL (ref 6.3–7.7)

## 2016-02-22 LAB — HEMOGLOBIN A1C: Hemoglobin A1C: 7.9 % — ABNORMAL HIGH (ref 4.0–6.0)

## 2016-02-23 LAB — MICROALBUMIN, URINE, RANDOM
Creatinine,UR: 139 mg/dL (ref 20–300)
Microalbumin,UR: <0.3 mg/dL

## 2016-03-27 ENCOUNTER — Other Ambulatory Visit: Payer: Self-pay | Admitting: Internal Medicine

## 2016-03-27 MED ORDER — GLIPIZIDE 5 MG PO TB24 *I*
5.0000 mg | ORAL_TABLET | Freq: Every day | ORAL | 5 refills | Status: DC
Start: 2016-03-27 — End: 2016-09-24

## 2016-03-27 NOTE — Telephone Encounter (Signed)
Rx request routed to provider on 03/27/2016 at 10:16 AM

## 2016-05-06 ENCOUNTER — Encounter: Payer: Self-pay | Admitting: Internal Medicine

## 2016-05-06 ENCOUNTER — Ambulatory Visit: Payer: Self-pay | Admitting: Internal Medicine

## 2016-05-06 VITALS — BP 134/86 | HR 84 | Temp 96.8°F | Ht 69.0 in | Wt 215.9 lb

## 2016-05-06 DIAGNOSIS — E7801 Familial hypercholesterolemia: Secondary | ICD-10-CM

## 2016-05-06 DIAGNOSIS — E119 Type 2 diabetes mellitus without complications: Secondary | ICD-10-CM

## 2016-05-06 NOTE — Progress Notes (Signed)
Subjective:     Patient ID: Dale Schultz is a 40 y.o. male.    HPI Diabetes, hyperlipidemia.    He is frustrated he has not lost more weight.  He has lost about 9 pounds the wanted to get down under 200.  He's been very careful with his diet.  He is trying to eliminate carbs.  He's very careful avoiding sweets but he does have 2 children who are in first and second grade so to stop to avoid.  He is tolerating the metformin but does have slightly loose stools with it.  He is able to tolerate it.  He is hoping his hemoglobin A1c is better but is too early to check today.  He does not check his sugars were good.  He is also exercising every other day taking 1 hour bike rides.  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    He did have some poison ivy for which he needed prednisone.    He did not want to make any changes on his medication despite having an elevated A1c of 7.9 and lipids up to 28.    He will get a flu shot later today at work.    He did see an eye physician in July with no signs her retinopathy.    Urine flow is at baseline.  Bowel habits have not changed without any blood.    He is under stress at work but doing well overall.    On physical exam, no acute distress.  Anicteric not pale.  Throat without exudate or erythema.  No oral lesions.  No cervical lymphadenopathy.  His chest is clear auscultation without crackles or wheeze.  CV regular rate and rhythm, S1, S2 without murmur or gallop.    Impression.    Diabetes with hyperlipidemia.  He does need a statin but he wants to hold off.  Likely would favor low-dose Atorvastatin.  He will continue with metformin and glipizide and he might need either a dose adjustment versus restarting long-acting insulins.  We will check repeat labs in the next few weeks.  I'll see him back in 4 months but will have close contact with him before and.  He was commended on his diet and  exercise program.  Advised not to get discouraged as he has lost some weight and this is much healthier for him.    Dale Schultz has Irritable Bowel Syndrome; Nephrolithiasis; Hyperlipidemia; and Diabetes mellitus on his problem list.  Current Outpatient Prescriptions on File Prior to Visit   Medication Sig Dispense Refill    glipiZIDE (GLUCOTROL) 5 MG 24 hr tablet Take 1 tablet (5 mg total) by mouth daily (with breakfast)   Swallow whole. Do not crush, break, or chew. 30 tablet 5    citalopram (CELEXA) 20 MG tablet Take 1 tablet (20 mg total) by mouth daily 30 tablet 5    metFORMIN (GLUCOPHAGE) 1000 MG tablet Take 1 tablet (1,000 mg total) by mouth 2 times daily (with meals) 60 tablet 5    nystatin (MYCOSTATIN) cream Apply topically 2 times daily   For 2-3 weeks 30 g 1    BAYER CONTOUR test strip tes 2-3 times daily as needed for 250.02 100 each 5    LANCETS ULTRA THIN MISC Test 3 times daily for 250.02 100 each 5    triamcinolone (KENALOG) 0.1 % cream Apply topically 2  times daily 30 g 1     No current facility-administered medications on file prior to visit.      Dale Schultz is allergic to penicillins.    Review of Systems  Physical Exam

## 2016-05-06 NOTE — Patient Instructions (Signed)
Please get labs done anytime after 05/25/16.

## 2016-06-17 ENCOUNTER — Encounter: Payer: Self-pay | Admitting: Internal Medicine

## 2016-06-17 ENCOUNTER — Other Ambulatory Visit
Admission: RE | Admit: 2016-06-17 | Discharge: 2016-06-17 | Disposition: A | Discharge status: Home / Self Care | Payer: Self-pay | Source: Ambulatory Visit | Attending: Internal Medicine | Admitting: Internal Medicine

## 2016-06-17 DIAGNOSIS — E7801 Familial hypercholesterolemia: Secondary | ICD-10-CM

## 2016-06-17 DIAGNOSIS — E119 Type 2 diabetes mellitus without complications: Secondary | ICD-10-CM

## 2016-06-17 LAB — LIPID PANEL
Chol/HDL Ratio: 4.5
Cholesterol: 191 mg/dL
HDL: 42 mg/dL
LDL Calculated: 111 mg/dL
Non HDL Cholesterol: 149 mg/dL
Triglycerides: 189 mg/dL — AB

## 2016-06-17 LAB — COMPREHENSIVE METABOLIC PANEL
ALT: 27 U/L (ref 0–50)
AST: 23 U/L (ref 0–50)
Albumin: 4.5 g/dL (ref 3.5–5.2)
Alk Phos: 31 U/L — ABNORMAL LOW (ref 40–130)
Anion Gap: 16 (ref 7–16)
Bilirubin,Total: 0.2 mg/dL (ref 0.0–1.2)
CO2: 24 mmol/L (ref 20–28)
Calcium: 9.4 mg/dL (ref 9.0–10.3)
Chloride: 100 mmol/L (ref 96–108)
Creatinine: 0.89 mg/dL (ref 0.67–1.17)
GFR,Black: 122 *
GFR,Caucasian: 105 *
Glucose: 87 mg/dL (ref 60–99)
Lab: 12 mg/dL (ref 6–20)
Potassium: 4.1 mmol/L (ref 3.3–5.1)
Sodium: 140 mmol/L (ref 133–145)
Total Protein: 7.1 g/dL (ref 6.3–7.7)

## 2016-06-17 MED ORDER — PREDNISONE 20 MG PO TABS *I*
ORAL_TABLET | ORAL | 0 refills | Status: DC
Start: 2016-06-17 — End: 2016-09-05

## 2016-06-17 NOTE — Telephone Encounter (Signed)
Left message for pt to call back regarding symptoms.  Call back number provided.

## 2016-06-18 LAB — HEMOGLOBIN A1C: Hemoglobin A1C: 6.7 % — ABNORMAL HIGH (ref 4.0–6.0)

## 2016-06-23 ENCOUNTER — Other Ambulatory Visit: Payer: Self-pay

## 2016-06-23 MED ORDER — METFORMIN HCL 1000 MG PO TABS *I*
1000.0000 mg | ORAL_TABLET | Freq: Two times a day (BID) | ORAL | 5 refills | Status: DC
Start: 2016-06-23 — End: 2016-12-06

## 2016-06-23 MED ORDER — CITALOPRAM HYDROBROMIDE 20 MG PO TABS *I*
20.0000 mg | ORAL_TABLET | Freq: Every day | ORAL | 5 refills | Status: DC
Start: 2016-06-23 — End: 2016-12-08

## 2016-09-09 ENCOUNTER — Telehealth: Payer: Self-pay | Admitting: Internal Medicine

## 2016-09-09 ENCOUNTER — Ambulatory Visit: Payer: Self-pay | Admitting: Internal Medicine

## 2016-09-09 DIAGNOSIS — E7801 Familial hypercholesterolemia: Secondary | ICD-10-CM

## 2016-09-09 DIAGNOSIS — E119 Type 2 diabetes mellitus without complications: Secondary | ICD-10-CM

## 2016-09-09 NOTE — Telephone Encounter (Signed)
Patient requesting blood lab orders be placed. Patient can be reached at [phone] to confirm.

## 2016-09-10 NOTE — Telephone Encounter (Signed)
Dr. Gerlene Burdockichard    Please see message below and advise. Patient is asking for blood work to be placed.

## 2016-09-10 NOTE — Telephone Encounter (Signed)
Labs ordered thank s 

## 2016-09-24 ENCOUNTER — Other Ambulatory Visit: Payer: Self-pay | Admitting: Internal Medicine

## 2016-09-24 MED ORDER — GLIPIZIDE 5 MG PO TB24 *I*
5.0000 mg | ORAL_TABLET | Freq: Every day | ORAL | 5 refills | Status: DC
Start: 2016-09-24 — End: 2016-10-23

## 2016-09-24 NOTE — Telephone Encounter (Signed)
Rx request routed to provider on 09/24/2016 at 2:39 PM

## 2016-10-15 ENCOUNTER — Ambulatory Visit: Payer: Self-pay | Admitting: Internal Medicine

## 2016-10-15 ENCOUNTER — Other Ambulatory Visit
Admission: RE | Admit: 2016-10-15 | Discharge: 2016-10-15 | Disposition: A | Discharge status: Home / Self Care | Payer: Self-pay | Source: Ambulatory Visit | Attending: Internal Medicine | Admitting: Internal Medicine

## 2016-10-15 DIAGNOSIS — E119 Type 2 diabetes mellitus without complications: Secondary | ICD-10-CM

## 2016-10-15 DIAGNOSIS — E7801 Familial hypercholesterolemia: Secondary | ICD-10-CM

## 2016-10-15 LAB — COMPREHENSIVE METABOLIC PANEL
ALT: 39 U/L (ref 0–50)
AST: 28 U/L (ref 0–50)
Albumin: 4.6 g/dL (ref 3.5–5.2)
Alk Phos: 33 U/L — ABNORMAL LOW (ref 40–130)
Anion Gap: 13 (ref 7–16)
Bilirubin,Total: 0.3 mg/dL (ref 0.0–1.2)
CO2: 28 mmol/L (ref 20–28)
Calcium: 9.9 mg/dL (ref 9.0–10.3)
Chloride: 96 mmol/L (ref 96–108)
Creatinine: 0.88 mg/dL (ref 0.67–1.17)
GFR,Black: 122 *
GFR,Caucasian: 106 *
Glucose: 163 mg/dL — ABNORMAL HIGH (ref 60–99)
Lab: 11 mg/dL (ref 6–20)
Potassium: 4.5 mmol/L (ref 3.3–5.1)
Sodium: 137 mmol/L (ref 133–145)
Total Protein: 6.9 g/dL (ref 6.3–7.7)

## 2016-10-15 LAB — LDL CHOLESTEROL, DIRECT: LDL Direct: 115 mg/dL

## 2016-10-15 LAB — LIPID PANEL
Chol/HDL Ratio: 6.6
Cholesterol: 223 mg/dL — AB
HDL: 34 mg/dL
Non HDL Cholesterol: 189 mg/dL
Triglycerides: 471 mg/dL — AB

## 2016-10-16 LAB — HEMOGLOBIN A1C: Hemoglobin A1C: 7.3 % — ABNORMAL HIGH (ref 4.0–6.0)

## 2016-10-23 ENCOUNTER — Encounter: Payer: Self-pay | Admitting: Internal Medicine

## 2016-10-23 ENCOUNTER — Other Ambulatory Visit: Payer: Self-pay | Admitting: Obstetrics and Gynecology

## 2016-10-23 MED ORDER — GLIPIZIDE 10 MG PO TB24 *I*
10.0000 mg | ORAL_TABLET | Freq: Every day | ORAL | 5 refills | Status: DC
Start: 2016-10-23 — End: 2017-04-14

## 2016-10-23 MED ORDER — ATORVASTATIN CALCIUM 20 MG PO TABS *I*
20.0000 mg | ORAL_TABLET | Freq: Every day | ORAL | 5 refills | Status: DC
Start: 2016-10-23 — End: 2017-04-14

## 2016-11-20 ENCOUNTER — Telehealth: Payer: Self-pay | Admitting: Internal Medicine

## 2016-11-20 NOTE — Telephone Encounter (Signed)
Mr. Dale Schultz called in regards to having severe right foot pain    Patient is requesting a call back    Patient can be reached at [phone]

## 2016-11-20 NOTE — Telephone Encounter (Signed)
Spoke to pt.  Pt. C/o severe right foot pain that is primarily in heel.  Pt. States feels like deep tissue pain, worse in am upon waking.  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  Pt. States never had this pain before.  Pt. Requesting office visit, referral to podiatrist.  Pt. Scheduled for 11/24/16@ 11 am with T. Harcleroad.  Pt. states verbal understanding.

## 2016-11-20 NOTE — Telephone Encounter (Signed)
Left msg for pt. To return Call to SIM office. Call back number provided.  Will attempt to reach pt. At a later time.

## 2016-11-24 ENCOUNTER — Encounter: Payer: Self-pay | Admitting: Obstetrics and Gynecology

## 2016-11-24 ENCOUNTER — Ambulatory Visit: Payer: BLUE CROSS/BLUE SHIELD | Attending: Obstetrics and Gynecology | Admitting: Obstetrics and Gynecology

## 2016-11-24 VITALS — BP 120/82 | HR 76 | Temp 96.8°F | Ht 69.02 in | Wt 224.0 lb

## 2016-11-24 DIAGNOSIS — E785 Hyperlipidemia, unspecified: Secondary | ICD-10-CM

## 2016-11-24 DIAGNOSIS — E119 Type 2 diabetes mellitus without complications: Secondary | ICD-10-CM

## 2016-11-24 DIAGNOSIS — M79671 Pain in right foot: Secondary | ICD-10-CM

## 2016-11-24 NOTE — Patient Instructions (Addendum)
[facility] [phone]  Continue rest, ice, stretches          Heel Pain (Caused by Plantar Fasciitis)    The Basics   Written by the doctors and editors at UpToDate   What causes heel pain?--One of the most common causes of heel pain is a problem called plantar fasciitis. Plantar fasciitis is the term doctors use when a part of the foot called the plantar fascia gets irritated or swollen. The plantar fascia is a tough band of tissue that connects the heel bone to the toes (figure 1).  Heel pain caused by plantar fasciitis is very common. It often affects people who run, jump, or stand for long periods. Most people who get this type of heel pain get better within a year even if they do not get treated.  What are the symptoms of plantar fasciitis?--The most common symptom is pain under the heel and sole (bottom) of the foot. The pain is often worst when you first get out of bed in the morning. It can also be bad when you get up after being seated for some time.  Is there anything I can do on my own to feel better?--Yes, you can:  ?Rest - Give your foot a chance to heal by resting. But don't completely stop being active. Doing that can lead to more pain and stiffness in the long run.   ?Ice your foot - Putting ice on your heel for 20 minutes up to 4 times a day might relieve pain. Icing and massaging your foot before exercise might also help.  ?Do special foot exercises - Certain exercises can help with heel pain. Do these exercises every day (figure 2).  ?Take pain medicines - If your pain is severe, you can try taking pain medicines that you can get without a prescription. Examples include ibuprofen (sample brand names: Advil, Motrin) and naproxen (sample brand name: Aleve). But if you have other medical conditions or already take other medicines, ask your doctor or nurse before taking new pain medicines.  ?Wear sturdy shoes - Sneakers with a lot of cushion and good arch and heel support are best.  Shoes with rigid soles can also help. Adding padded or gel heel inserts to your shoes might help, too.   ?Wear splints at night - Some people feel better if they wear a splint while they sleep that keeps their foot straight. These splints are sold in drugstores and medical supply stores.  Is there a test for plantar fasciitis?--No, there is no test. But your doctor or nurse should be able to tell if you have it by learning about your symptoms and doing an exam. He or she might suggest an X-ray, or other tests to check whether your symptoms might be caused by something else.  How is plantar fasciitis treated?--The first step is to try the things you can do on your own. But if you do not get better, or your symptoms are severe, your doctor or nurse might suggest:  ?Taping up your foot in a special way that helps the support the foot (picture 1)  ?Special shoe inserts, made to fit your foot  ?Shots (that go into your foot) of a medicine called a steroid, which can help with the pain   ?Putting a splint over your foot and ankle  ?Surgery (this is an option only in some cases that do not get better with other treatments)  Some doctors also suggest a treatment called shock  wave therapy. This treatment is painful and has not been proven to work.  Is there anything I can do to keep from getting heel pain again?--Yes. To reduce the chances that your pain will come back:  ?Wear shoes that fit well, have a lot of cushion, and support the heel and ankle   ?Avoid wearing slippers, flip-flops, slip-ons, or poorly fitted shoes  ?Avoid going barefoot  ?Do not wear worn-out shoes  All topics are updated as new evidence becomes available and our peer review process is complete.  This topic retrieved from UpToDate on: Dec 14, 2013.  Topic 15441 Version 4.0  Release: 23.3 - C23.74  2015UpToDate, Inc.All rights reserved.  figure 1: Plantar fasciitis     The plantar fascia is a tough band of tissue that connects the heel bone  to the toes.  Graphic 60454 Version 8.0  figure 2: Exercises that can help with heel pain     Graphic 09811 Version 1.0  picture 1: Foot taping for plantar fasciitis     This way of taping the foot sometimes helps with plantar fasciitis. Here are the steps involved, from left to right. 1) Wrap a strip of tape around the foot, at the level of the ball of the foot. 2) Wrap a second strip of tape around the heel, starting just below the pinky toe, around the sides of the heel, and back up to the first strip of tape. 3) Wrap a third strip of tape around the heel, starting just below the pinky toe, like you did in step 2. This time, circle the heel and wrap the tape in a criss-cross, so that it ends just below the big toe. 4) Repeat step 3. The tape does not need to align perfectly. The tape can stay in place for one week.  Graphic 91478 Version 2.0  Consumer Information Use and Disclaimer   This information is not specific medical advice and does not replace information you receive from your health care provider. This is only a brief summary of general information. It does NOT include all information about conditions, illnesses, injuries, tests, procedures, treatments, therapies, discharge instructions or life-style choices that may apply to you. You must talk with your health care provider for complete information about your health and treatment options. This information should not be used to decide whether or not to accept your health care provider's advice, instructions or recommendations. Only your health care provider has the knowledge and training to provide advice that is right for you.The use of UpToDate content is governed by the UpToDate Terms of Use. 2015 UpToDate, Inc. All rights reserved.  Copyright   2015UpToDate, Inc.All rights reserved.

## 2016-11-24 NOTE — Progress Notes (Signed)
Reason For Visit: Pain of right heel, hyperlipidemia, diabetes.    HPI:  Dale Schultz is a 41 y.o. year old male with PMH significant for irritable bowel syndrome, hyperlipidemia, diabetes.    He reports that he has been experiencing pain in his right heel on and off since January.  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  He has been doing stretches since Friday as well as applying ice and taking ibuprofen.  He notes that the pain has improved.  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  He reports that he wears supportive shoes at work.  He is requesting a referral to podiatry.    He has been taking atorvastatin 20 mg daily.  He denies any adverse effects.    He has been taking metformin  twice a day and glipizide 10 mg daily and denies any adverse effects.      Medications:     Current Outpatient Prescriptions   Medication Sig    atorvastatin (LIPITOR) 20 MG tablet Take 1 tablet (20 mg total) by mouth daily    glipiZIDE (GLUCOTROL) 10 MG 24 hr tablet Take 1 tablet (10 mg total) by mouth daily (with breakfast)   Swallow whole. Do not crush, break, or chew.    citalopram (CELEXA) 20 MG tablet Take 1 tablet (20 mg total) by mouth daily    metFORMIN (GLUCOPHAGE) 1000 MG tablet Take 1 tablet (1,000 mg total) by mouth 2 times daily (with meals)    BAYER CONTOUR test strip tes 2-3 times daily as needed for 250.02    LANCETS ULTRA THIN MISC Test 3 times daily for 250.02     No current facility-administered medications for this visit.        Medication list reconciled this visit    Allergies:     Allergies   Allergen Reactions    Penicillins      Created by Conversion - 0;          Review of Systems   Review of Systems   Constitutional: Negative.    Respiratory: Negative.    Cardiovascular: Negative.    Musculoskeletal:        Right heel pain   Neurological: Negative.        Physical Exam:   Physical Exam   Constitutional: He is  oriented to person, place, and time. He appears well-developed and well-nourished.   Cardiovascular: Normal rate, regular rhythm and normal heart sounds.    Pulmonary/Chest: Effort normal and breath sounds normal.   Musculoskeletal:   Tenderness at medial insertion of plantar fascia into calcaneous; some foot pronation   Neurological: He is alert and oriented to person, place, and time.   Skin: Skin is warm and dry.       Vitals:    11/24/16 1118   BP: 120/82   Pulse: 76   Temp: 36 C (96.8 F)   TempSrc: Temporal   Weight: 101.6 kg (224 lb)   Height: 1.753 m (5' 9.02")           ASSESSMENT/PLAN:     1. Pain of right heel  - AMB REFERRAL TO PODIATRY  Continue stretches, rest, ice, and NSAIDs    2. Hyperlipidemia, unspecified hyperlipidemia type  - Lipid add Rfx to Drt LDL if Trig >400; Future  Continue atorvastatin 20 mg daily    3. Diabetes mellitus  Continue metformin  1000 mg twice a day  Continue glipizide 10 mg daily        Follow up with Dr. Gerlene Burdock as needed      Vella Redhead, NP on 11/24/2016 at 2:22 PM

## 2016-12-06 ENCOUNTER — Other Ambulatory Visit: Payer: Self-pay | Admitting: Internal Medicine

## 2016-12-08 ENCOUNTER — Other Ambulatory Visit: Payer: Self-pay

## 2016-12-08 MED ORDER — CITALOPRAM HYDROBROMIDE 20 MG PO TABS *I*
20.0000 mg | ORAL_TABLET | Freq: Every day | ORAL | 5 refills | Status: DC
Start: 2016-12-08 — End: 2017-06-14

## 2016-12-19 ENCOUNTER — Other Ambulatory Visit: Payer: Self-pay | Admitting: Obstetrics and Gynecology

## 2017-01-14 ENCOUNTER — Ambulatory Visit: Payer: BLUE CROSS/BLUE SHIELD | Attending: Internal Medicine | Admitting: Internal Medicine

## 2017-01-14 ENCOUNTER — Encounter: Payer: Self-pay | Admitting: Internal Medicine

## 2017-01-14 ENCOUNTER — Ambulatory Visit: Payer: Self-pay | Admitting: Internal Medicine

## 2017-01-14 VITALS — BP 130/80 | HR 80 | Temp 96.8°F | Wt 216.1 lb

## 2017-01-14 DIAGNOSIS — F419 Anxiety disorder, unspecified: Secondary | ICD-10-CM

## 2017-01-14 DIAGNOSIS — E119 Type 2 diabetes mellitus without complications: Secondary | ICD-10-CM

## 2017-01-14 DIAGNOSIS — E781 Pure hyperglyceridemia: Secondary | ICD-10-CM

## 2017-01-14 DIAGNOSIS — E7801 Familial hypercholesterolemia: Secondary | ICD-10-CM

## 2017-01-14 NOTE — Progress Notes (Signed)
[facility] Follow-up Note     ReasonReason For Visit:   Diabetes     HPI:      Dale Schultz is 41 y.o. year old male coming in to discuss the following issues:    DM2 - Patient reports adherence 100% of the time. Taking metformin and glipizide without adverse effects. Has been exercising somewhat less due to work related stress, though has lost some weight since his last visit, though still about 200 (initial weight loss goal is <200).  Works as a Presenter, broadcasting. Looking forward to being more active outdoors this summer. Rarely checks BGs at home. Sometimes has some symptoms of hypoglycemia, when he check his BGs during these episodes, they are in the low 100s. No blurry vision, polyuria, polydipsia, or peripheral neuropathy. No wounds on feet.     HLD - on statin without myalgias.     Anxiety - doing well. Discussed possible wean off celexa. Not sure if it is helping or not. Feels his current anxiety is fine.    Review of Systems:     ROS   10-point ROS negative except as above.   Medications/Allergies/Immunizations:     Medication list reviewed and reconciled this visit in the EMR. Allergy list confirmed in the EMR.    Past Medical History/Family History/Social History:     Past Medical History, Family History, and Social History reviewed, confirmed, and updated as appropriate this visit in the EMR.     Physical Exam:     Vitals:    01/14/17 1142   BP: 130/80   BP Location: Left arm   Patient Position: Sitting   Cuff Size: large adult   Pulse: 80   Temp: 36 C (96.8 F)   TempSrc: Temporal   Weight: 98 kg (216 lb 1.6 oz)     There were no vitals filed for this visit.  BP Readings from Last 3 Encounters:   01/14/17 130/80   11/24/16 120/82   05/06/16 134/86     Wt Readings from Last 3 Encounters:   01/14/17 98 kg (216 lb 1.6 oz)   11/24/16 101.6 kg (224 lb)   05/06/16 97.9 kg (215 lb 14.4 oz)       Vitals Signs: RUE:AVWU mass index is 31.9 kg/(m^2)., otherwise as  above, reviewed with patient    General: Pleasant, humorous, male appears comfortable and in NAD  HEENT: No conjunctival injection, no conjunctival pallor, no scleral icterus.   Neck:  Supple with full AROM without pain. No cervical, supraclavicular  LAD.   Cardiovascular: Regular rate, regular rhythm, no murmurs, rubs or gallops.No S3 or S4 appreciated. 2+ radial pulses b/l.  Resp: CTAB. No w/r/r. Normal work of breathing.   Extremity: No pedal edema. Lower extremities appear symmetric.   Skin: No wounds or ulcers noted on feet. No rashes noted.   Psych: Affect appropriate. Speech is not pressured.     Diabetic Foot Exam: Pulses intact. Sensation intact to monofilament. No ulcers. Proprioception intact.     Labs and Imaging:   Labs and Imaging results reviewed with [facility] .       Assessment and Plan:     Bee was seen today for diabetes.    Diagnoses and all orders for this visit:    Type 2 diabetes mellitus without complication, without long-term current use of insulin - Goal Hgb A1c < 7.0; Hgb A1c is above at goal; Denies adverse effects of the medications. Denies symptoms  of hyperglycemia (these symptoms were reviewed and emergency interventions discussed). No objective hypoglycemia. Urine microablumin is uptodate, but needs ot be rechecked. Marland Kitchen. Ophthalmology is  uptodate. Is  on a statin. Would discuss pneumovax next visit. Diabetic foot exam done, no  issues. Would repeat A1C today, if still above goal, we discussed possible change of medication to victoza given the positive CV outcomes. Handout provided for him to read. Dietary counseling done. Stress an exercise management counseling done  -     Hemoglobin A1c; Future  -     Microalbumin, Urine, Random; Future    Familial hypercholesterolemia - recheck today  -     Lipid add Rfx to Drt LDL if Trig >400; Future    Hypertriglyceridemia - Last triglycerides were quite elevated, though not fasting. Would repeat labs as fasting within 1 week. Is on a statin now.      -     Lipid add Rfx to Drt LDL if Trig >400; Future    Anxiety - No SI/HI. No depression. Doing well overall, mainly related to work stress, less commonly family stress. Has been on celexa for an extended time period, but wondering if its helping or not. Reasonable to try to wean if he would like. Recommended that he cut tab in 1/2 for 2 weeks, then consider 1/4 tab for 1 week then stop. If anxiety worsens, he will return to 20mg . If he has withdrawal symptoms we will slow the taper.    [facility]:     [facility] Diabetes Plan    According to current ADA guidelines the patient A1C goal is: less than 7  The patient's last A1C was 10/15/2016: Hemoglobin A1C 7.3 %* (Ref range: 4.0 - 6.0 %), the patient is: above goal and discussed the A1C goal with the patient.  Diabetic foot exam was performed:  Yes  The plan to reach goal   1.  Reviewed pt's understanding of medications including barriers to adherence    2.  Recommended lifestyle modifications: weight reduction, exercise plan, diet improvements, glucose monitoring and medication compliance   3.  The patient understands their clinical goals and will undertake self-management recommendations including: weight reduction  exercise plan  diet improvements  glucose monitoring   4.  Medications Management: no changes made   5.  Referral to Care Management:: No   6.  Patient Ed/Self Management tools provided: Yes    Handouts Given: Patient educational materials distributed by print-out and/or inserted into AVS   Follow-up:   RTC:  3 months for DM  Author:   Ozzie HoyleJared P Chayden Garrelts, MD Chief Resident, Internal Medicine 01/14/2017 12:47 PM

## 2017-01-14 NOTE — Patient Instructions (Signed)
Liraglutide (lir a GLOO tide)    Brand Names: [place] Saxenda; Victoza   Brand Names: [place] Victoza   Warning   · This drug has been shown to cause thyroid cancer in some animals. It is not known if this happens in humans. If thyroid cancer happens, it may be deadly if not found and treated early. Call your doctor right away if you have a neck mass, trouble breathing, trouble swallowing, or have hoarseness that will not go away.  · Do not use this drug if you have a health problem called Multiple Endocrine Neoplasia syndrome type 2 (MEN 2), or if you or a family member have had thyroid cancer.  What is this drug used for?   · It is used to lower blood sugar in patients with high blood sugar (diabetes).  · It is used to help you lose weight.  What do I need to tell my doctor BEFORE I take this drug?   · For all patients taking this drug:  · If you have an allergy to liraglutide or any other part of this drug.  · If you are allergic to any drugs like this one, any other drugs, foods, or other substances. Tell your doctor about the allergy and what signs you had, like rash; hives; itching; shortness of breath; wheezing; cough; swelling of face, lips, tongue, or throat; or any other signs.  · If you have or have ever had low mood (depression) or thoughts of killing yourself.  · If you are using another drug that has the same drug in it.  · If using for high blood sugar:  · If you have any of these health problems: Acidic blood problem or type 1 diabetes.  · If using for weight loss:  · If you are pregnant or may be pregnant. Do not use this drug if you are pregnant.  · If you are using insulin.  · This is not a list of all drugs or health problems that interact with this drug.  · Tell your doctor and pharmacist about all of your drugs (prescription or OTC, natural products, vitamins) and health problems. You must check to make sure that it is safe for you to take this drug with all of your drugs and health problems.  Do not start, stop, or change the dose of any drug without checking with your doctor.  What are some things I need to know or do while I take this drug?   · For all patients taking this drug:  · Tell dentists, surgeons, and other doctors that you use this drug.  · Have blood work checked as you have been told by the doctor. Talk with the doctor.  · Talk with your doctor before you drink alcohol.  · A very bad and sometimes deadly pancreas problem (pancreatitis) has happened with this drug. Talk with your doctor.  · If you cannot drink liquids by mouth or if you have upset stomach, throwing up, or diarrhea that does not go away, you need to avoid getting dehydrated. Contact your doctor to find out what to do. Dehydration may lead to new or worse kidney problems.  · Do not share pen or cartridge devices with another person even if the needle has been changed. Sharing these devices may pass infections from one person to another. This includes infections you may not know you have.  · Do not give to a child. Talk with your doctor.  · Tell your doctor if   you are breast-feeding. You will need to talk about any risks to your baby.  · If using for high blood sugar:  · Wear disease medical alert ID (identification).  · Do not drive if your blood sugar has been low. There is a greater chance of you having a crash.  · Check your blood sugar as you have been told by your doctor.  · It may be harder to control your blood sugar during times of stress like when you have a fever, an infection, an injury, or surgery. Talk with your doctor.  · Tell your doctor if you are pregnant or plan on getting pregnant. You will need to talk about the benefits and risks of using this drug while you are pregnant.  · If using for weight loss:  · If you have high blood sugar (diabetes), you will need to watch your blood sugar closely.  · Weight loss during pregnancy may cause harm to the unborn baby. If you get pregnant while taking this drug or if  you want to get pregnant, call your doctor right away.  What are some side effects that I need to call my doctor about right away?   · WARNING/CAUTION: Even though it may be rare, some people may have very bad and sometimes deadly side effects when taking a drug. Tell your doctor or get medical help right away if you have any of the following signs or symptoms that may be related to a very bad side effect:  · For all patients taking this drug:  · Signs of an allergic reaction, like rash; hives; itching; red, swollen, blistered, or peeling skin with or without fever; wheezing; tightness in the chest or throat; trouble breathing or talking; unusual hoarseness; or swelling of the mouth, face, lips, tongue, or throat.  · Signs of a pancreas problem (pancreatitis) like very bad stomach pain, very bad back pain, or very bad upset stomach or throwing up.  · Signs of gallstones like sudden pain in the upper right belly area, right shoulder area, or between the shoulder blades; yellow skin or eyes; or fever with chills.  · Dizziness or passing out.  · A fast heartbeat.  · A heartbeat that does not feel normal.  · Yellow skin or eyes.  · Not able to pass urine or change in how much urine is passed.  · New or worse behavior or mood changes like depression or thoughts of killing yourself.  · Low blood sugar may occur. Signs may be dizziness, headache, feeling sleepy, feeling weak, shaking, a fast heartbeat, confusion, hunger, or sweating. Keep glucose tablets or liquid glucose on hand for low blood sugar.  What are some other side effects of this drug?   · All drugs may cause side effects. However, many people have no side effects or only have minor side effects. Call your doctor or get medical help if any of these side effects or any other side effects bother you or do not go away:  · For all patients taking this drug:  · Headache.  · Loose stools (diarrhea).  · Hard stools (constipation).  · Upset stomach or throwing  up.  · Irritation where the shot is given.  · If using for weight loss:  · Not hungry.  · Belly pain.  · Feeling tired or weak.  · These are not all of the side effects that may occur. If you have questions about side effects, call your doctor. Call your doctor for   medical advice about side effects.  · You may report side effects to your national health agency.  How is this drug best taken?   · Use this drug as ordered by your doctor. Read all information given to you. Follow all instructions closely.  · For all patients taking this drug:  · It is given as a shot into the fatty part of the skin on the top of the thigh, belly area, or upper arm.  · Take with or without food.  · Drink lots of noncaffeine liquids unless told to drink less liquid by your doctor.  · Your doctor will teach you how to give the shot.  · Follow how to use as you have been told by the doctor or read the package insert.  · Do not use if the solution is cloudy, leaking, or has particles.  · Do not use if solution changes color.  · Wash your hands before and after use.  · Prepare pen before first use as you have been told. You will also need to do this if you drop the pen.  · Take as you have been told, even if you feel well.  · Throw away needles in a needle/sharp disposal box. Do not reuse needles or other items. When the box is full, follow all local rules for getting rid of it. Talk with a doctor or pharmacist if you have any questions.  · Attach new needle before each dose.  · Follow the diet and workout plan that your doctor told you about.  · If using for high blood sugar:  · Do not mix this drug in the same syringe with insulin.  · Give this drug at some other site from where you gave your insulin if you are also getting insulin.  What do I do if I miss a dose?   · Take a missed dose as soon as you think about it.  · If it is close to the time for your next dose, skip the missed dose and go back to your normal time.  · Do not take 2  doses at the same time or extra doses.  · If you miss 3 days of this drug, call your doctor to find out what to do.  How do I store and/or throw out this drug?   · Store unopened pens in a refrigerator. Do not freeze.  · Store opened pens at room temperature or in a refrigerator. Do not freeze.  · Take off the needle after each shot. Do not store this device with the needle on it.  · Do not use if it has been frozen.  · Protect from light.  · Protect from heat.  · Keep the cap on the pen when not in use.  · Throw away any unused portion after 30 days.  · Do not use if this drug is out of date.  · Keep all drugs out of the reach of children and pets.  · Check with your pharmacist about how to throw out unused drugs.  General drug facts   · If your symptoms or health problems do not get better or if they become worse, call your doctor.  · Do not share your drugs with others and do not take anyone else's drugs.  · Keep a list of all your drugs (prescription, natural products, vitamins, OTC) with you. Give this list to your doctor.  · Talk with the doctor before starting any new drug,   including prescription or OTC, natural products, or vitamins.  · Some drugs may have another patient information leaflet. If you have any questions about this drug, please talk with your doctor, pharmacist, or other health care provider.  · If you think there has been an overdose, call your poison control center or get medical care right away. Be ready to tell or show what was taken, how much, and when it happened.  Consumer Information Use and Disclaimer   · This information should not be used to decide whether or not to take this medicine or any other medicine. Only the healthcare provider has the knowledge and training to decide which medicines are right for a specific patient. This information does not endorse any medicine as safe, effective, or approved for treating any patient or health condition. This is only a brief summary of  general information about this medicine. It does NOT include all information about the possible uses, directions, warnings, precautions, interactions, adverse effects, or risks that may apply to this medicine. This information is not specific medical advice and does not replace information you receive from the healthcare provider. You must talk with the healthcare provider for complete information about the risks and benefits of using this medicine.  Last Reviewed Date   2013-11-28  Copyright   · © 2015 [employer] and its affiliates and/or licensors. All rights reserved.

## 2017-03-05 ENCOUNTER — Encounter: Payer: Self-pay | Admitting: Obstetrics and Gynecology

## 2017-03-05 ENCOUNTER — Telehealth: Payer: Self-pay | Admitting: Internal Medicine

## 2017-03-05 NOTE — Telephone Encounter (Signed)
Patient is calling back to speak with nurse Jonny RuizJohn regarding symptoms stated in patient email from today.    He can be reached at [phone]

## 2017-03-05 NOTE — Telephone Encounter (Signed)
Spoke with pt.  Pt states he developed a cold over a week ago.  Pt states he continues to have pain behind his eyes, green nasal discharge, congestion.  Pt denies fever/chills, but notes he feels worse today than yesterday.  Advised pt to be seen in the office.  Pt scheduled to see CC 7/13 at 10:00.  Pt verbalized understanding.

## 2017-03-05 NOTE — Telephone Encounter (Signed)
Left message for pt to call back regarding symptoms.  Call back number provided.

## 2017-03-06 ENCOUNTER — Other Ambulatory Visit
Admission: RE | Admit: 2017-03-06 | Discharge: 2017-03-06 | Disposition: A | Discharge status: Home / Self Care | Payer: BLUE CROSS/BLUE SHIELD | Source: Ambulatory Visit | Attending: Internal Medicine | Admitting: Internal Medicine

## 2017-03-06 ENCOUNTER — Ambulatory Visit: Payer: BLUE CROSS/BLUE SHIELD | Attending: Neuropalliative Care | Admitting: Pulmonology

## 2017-03-06 VITALS — BP 136/84 | HR 64 | Temp 96.6°F | Wt 221.7 lb

## 2017-03-06 DIAGNOSIS — E7801 Familial hypercholesterolemia: Secondary | ICD-10-CM | POA: Insufficient documentation

## 2017-03-06 DIAGNOSIS — J0191 Acute recurrent sinusitis, unspecified: Secondary | ICD-10-CM | POA: Insufficient documentation

## 2017-03-06 DIAGNOSIS — E781 Pure hyperglyceridemia: Secondary | ICD-10-CM | POA: Insufficient documentation

## 2017-03-06 DIAGNOSIS — E785 Hyperlipidemia, unspecified: Secondary | ICD-10-CM

## 2017-03-06 DIAGNOSIS — E119 Type 2 diabetes mellitus without complications: Secondary | ICD-10-CM | POA: Insufficient documentation

## 2017-03-06 DIAGNOSIS — J0101 Acute recurrent maxillary sinusitis: Secondary | ICD-10-CM

## 2017-03-06 LAB — LIPID PANEL
Chol/HDL Ratio: 3.2
Cholesterol: 131 mg/dL
HDL: 41 mg/dL
LDL Calculated: 68 mg/dL
Non HDL Cholesterol: 90 mg/dL
Triglycerides: 111 mg/dL

## 2017-03-06 LAB — MICROALBUMIN, URINE, RANDOM
Creatinine,UR: 63 mg/dL (ref 20–300)
Microalbumin,UR: <0.3 mg/dL

## 2017-03-06 LAB — MULTIPLE ORDERING DOCS

## 2017-03-06 MED ORDER — DOXYCYCLINE HYCLATE 100 MG PO CAPS *I*
100.0000 mg | ORAL_CAPSULE | Freq: Two times a day (BID) | ORAL | 0 refills | Status: AC
Start: 2017-03-06 — End: 2017-03-13

## 2017-03-06 NOTE — Patient Instructions (Addendum)
Thanks for visiting urgent care today. Here is a summary of today's plan    Please take doxycycline 100mg  twice a day for a week  We made a referral to ENT here at [facility] to follow up regarding your recurrent acute sinusitis.     Best regards,  Waynette ButteryAndrew Nikira Kushnir, MD

## 2017-03-06 NOTE — Progress Notes (Signed)
[facility] Progress Note  Subjective: Reason For Visit:   Chief Complaint   Patient presents with    Sinusitis       ID/HPI:  89M w/DM2 (metformin,glipizide), HLD, anxiety (celexa) who p/w sinus congestion.    He has a 10 day history of pain behind eyes and maxillary region, green nasal discharge, congestion, difficulty sleeping due to congestion, and headache. Sometimes there is blood tinge to his nasal discharge. He denies fever/chills, vomiting, diarrhea, sore throat, itchy eyes, red eyes. He has had sinus infections 1-2x/year for many years. He rarely gets these infections during the summer. He had tried nasal rinses before. He has received doxycycline and z packs in the past with good improvement. He has a penicillin allergy so did not receive augmentin.     He would like referral to ENT about frequency of sinus infections.     ROS: Pertinent positives and negatives as per HPI    Relevant PMH/Surg Hx/Fam Hx/Social Hx: as above, recurrent sinus infections 1-2x/year for many years    Meds:  Metformin 1000 daily, citalopram 20mg  daily, atorvastatin 20mg  daily, glipizide 10mg  daily, bayer glucose strips prn.     Medications reviewed and reconciled.     Allergies:   pencillin gives him hives     Objective:  Vitals   Vitals:    03/06/17 1011   BP: 136/84   Pulse: 64   Temp: 35.9 C (96.6 F)   TempSrc: Temporal   Weight: 100.6 kg (221 lb 11.2 oz)     Wt Readings from Last 3 Encounters:   03/06/17 100.6 kg (221 lb 11.2 oz)   01/14/17 98 kg (216 lb 1.6 oz)   11/24/16 101.6 kg (224 lb)     BP Readings from Last 3 Encounters:   03/06/17 136/84   01/14/17 130/80   11/24/16 120/82       Exam:   General: Pleasant. Alert/interactive. No acute distress  HEENT: moist mucus membranes, conjunctiva anicteric, has had tonsillectomy, macroglossia. Tympanic membranes are pearly gray with cone of light, no effusion or erythema.   Neck: Anterior cervical lymphadenopathy, no occipital, submandibular,  supracervical LA.  Heart: rhythm regular/irregular, s1 s2, no s3 s4, no murmurs or rubs  Lung: Normal work of breathing, clear to auscultation bilaterally, anteriorly and posteriorly, no wheeze crackle ronchi stridor rub.  Abd: non distended, bowel sounds, soft, non tender to palpation. No organomegaly or masses  Ext: warm well perfused, 2+ peripheral pulses, no edema  Neuro: following commands, alert and oriented x 3  Skin: Benign, no rashes, ecchymoses, ulcers      A/P: 89M with recurrent sinusitis who presents with sinus congestion, headache, difficulty sleeping, maxillary sinus tenderness, consistent with acute sinusitis. Patient would appreciate ENT referral, has penicllin allergy, has received doxycyline or z pack in the past.  #acute sinusitis  -doxycycline 100mg  BID for 1 week  -amb referral to ENT for recurrent maxillary sinusitis\    Follow up PRN if symptoms worsen.    Waynette ButteryAndrew Aine Strycharz, MD, 03/06/2017 11:20 AM  PGY-1, [facility] [phone], [place] [facility]

## 2017-03-07 LAB — HEMOGLOBIN A1C: Hemoglobin A1C: 7.7 % — ABNORMAL HIGH (ref 4.0–6.0)

## 2017-04-14 ENCOUNTER — Other Ambulatory Visit: Payer: Self-pay | Admitting: Obstetrics and Gynecology

## 2017-05-07 ENCOUNTER — Ambulatory Visit: Payer: BLUE CROSS/BLUE SHIELD | Admitting: Internal Medicine

## 2017-05-08 ENCOUNTER — Ambulatory Visit: Payer: BLUE CROSS/BLUE SHIELD | Attending: Internal Medicine | Admitting: Internal Medicine

## 2017-05-08 ENCOUNTER — Encounter: Payer: Self-pay | Admitting: Internal Medicine

## 2017-05-08 VITALS — BP 130/84 | HR 78 | Temp 95.9°F | Ht 69.02 in | Wt 217.8 lb

## 2017-05-08 DIAGNOSIS — Z23 Encounter for immunization: Secondary | ICD-10-CM

## 2017-05-08 DIAGNOSIS — E119 Type 2 diabetes mellitus without complications: Secondary | ICD-10-CM

## 2017-05-08 DIAGNOSIS — E785 Hyperlipidemia, unspecified: Secondary | ICD-10-CM

## 2017-05-08 DIAGNOSIS — F419 Anxiety disorder, unspecified: Secondary | ICD-10-CM

## 2017-05-08 MED ORDER — GLIPIZIDE 10 MG PO TB24 *I*
20.0000 mg | ORAL_TABLET | Freq: Every day | ORAL | 5 refills | Status: DC
Start: 2017-05-08 — End: 2017-10-09

## 2017-05-08 NOTE — Progress Notes (Signed)
[facility] Follow-up Note     ReasonReason For Visit:   Follow-up      HPI:      Dale Schultz is 41 y.o. year old male coming in to discuss the following issues:    1. DM- last A1c was 7.7 in July. He continues to take the glipizide, and metformin at this time. He does not mind doing self injections for victoza but is wondering about other options. He does not check his BG regularly at home but is 100% adherent to his medications. 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 Knows what changes need to be made.     2. HLD- Lipid panel showed improvement in his trig and cholesterol while on the statin. He is encouraged that his numbers have improved. Tolerating the statin well.     3. Anxiety- he would like to come off of the celexa. This was started around the time of his first child 8 years ago, and he notes that his mood and general stress level have decreased substantially.     4. Left shoulder pain- he has not had any trauma. This has been present for about 1 month on the left side, mostly after lifting heavy boxes or with 90 degree flexion of the arm. No change in sensation or strength. Denies neck pain. It is an aching pain about 3-4/10. He has taken some ibuprofen sparingly for this pain.     Past Medical History:   Diagnosis Date    Anxiety     Diabetes mellitus     HLD (hyperlipidemia)          Review of Systems:     Review of Systems   Constitutional: Negative for chills, diaphoresis, fever, malaise/fatigue and weight loss.   HENT: Negative for hearing loss and tinnitus.    Eyes: Negative for blurred vision, double vision and photophobia.   Respiratory: Negative for cough, shortness of breath and wheezing.    Cardiovascular: Negative for chest pain, palpitations and leg swelling.   Gastrointestinal: Negative for abdominal pain, constipation, diarrhea, heartburn, nausea and vomiting.   Genitourinary: Negative for dysuria,  frequency, hematuria and urgency.   Musculoskeletal: Positive for back pain and joint pain. Negative for falls, myalgias and neck pain.   Skin: Negative for itching and rash.   Neurological: Negative for dizziness, weakness and headaches.   Endo/Heme/Allergies: Does not bruise/bleed easily.   Psychiatric/Behavioral: Negative for depression and substance abuse. The patient is not nervous/anxious and does not have insomnia.         Medications/Allergies/Immunizations:     Medication list reviewed and reconciled this visit in the EMR. Allergy list confirmed in the EMR.    Current Outpatient Prescriptions   Medication Sig    atorvastatin (LIPITOR) 20 MG tablet TAKE 1 TABLET BY MOUTH EVERY DAY    glipiZIDE (GLUCOTROL) 10 MG 24 hr tablet TAKE 1 TABLET BY MOUTH EVERY DAY WITH BREAKFAST SWALLOW WHOLE, DO NOT BREAK,CRUSH OR CHEW    metFORMIN (GLUCOPHAGE) 1000 MG tablet TAKE 1 TABLET BY MOUTH TWO TIMES DAILY WITH MEALS    citalopram (CELEXA) 20 MG tablet Take 1 tablet (20 mg total) by mouth daily    BAYER CONTOUR test strip tes 2-3 times daily as needed for 250.02    LANCETS ULTRA THIN MISC Test 3 times daily for 250.02         Past Medical History/Family History/Social History:  Past Medical History, Family History, and Social History reviewed, confirmed, and updated as appropriate this visit in the EMR.     Physical Exam:     Vitals:    05/08/17 1118   BP: 130/84   Pulse: 78   Temp: 35.5 C (95.9 F)   TempSrc: Temporal   Weight: 98.8 kg (217 lb 12.8 oz)   Height: 1.753 m (5' 9.02")       BP Readings from Last 3 Encounters:   05/08/17 130/84   03/06/17 136/84   01/14/17 130/80     Wt Readings from Last 3 Encounters:   05/08/17 98.8 kg (217 lb 12.8 oz)   03/06/17 100.6 kg (221 lb 11.2 oz)   01/14/17 98 kg (216 lb 1.6 oz)       Vitals Signs: ZOX:WRUE mass index is 32.15 kg/(m^2)., otherwise as above, reviewed with patient    General: In no apparent distress. Pleasant  HEENT: Eyes- PERRL. Throat- No exudates.  Neck:   No LAD.  Pulmonary: CTA B/L. No wheezes, rhonchi, or rales.  Cardiovascular: No JVD, No carotid bruits. RRR. No murmurs, rubs, or gallops. No pedal edema.  Abdominal: Soft. Non-distended. Non-tender. Bowel sounds present.  Skin: No wounds on feet.  Neuro: AAOx3, no focal deficits  MSK: ROM full on b/l UE. Empty can test elicits pain on the left shoulder. Internal/external rotation of the shoulder do not elicit pain. Near and hawkins signs negative. No painful arch. No deformity at this time.   Psych: Affect appropriate. Speech is not pressured.     Labs and Imaging:   Labs and Imaging results reviewed with patient.       Assessment and Plan:     [name] was seen today for follow-up.    Diagnoses and all orders for this visit:    Type 2 diabetes mellitus without complication, without long-term current use of insulin  -     glipiZIDE (GLUCOTROL) 10 MG 24 hr tablet; Take 2 tablets (20 mg total) by mouth daily (with breakfast)   SWALLOW WHOLE, DO NOT BREAK,CRUSH OR CHEW  - will increase to 20 mg daily   - he will monitor BG going forward  - will consider Victoza if he is still poorly controlled in 2 months  - continue metformin     Anxiety  - will down titrate the celexa with plan to stop it over the next 3-4 weeks.     Hyperlipidemia, unspecified hyperlipidemia type  - continue statin     Rotator Cuff tendinopathy  - given stretching exercises for rotator cuff  - NSAIDS prn for pain  - will consider formal PT if no improvement in the next 1-2 months    Other orders  -     Flu vaccine quadrivalent greater than or equal to 63mo preservative free IM        PCMH:   PCMH Hypertension Plan    Based on this patients clinical history and according to JNC guidelines target BP goal is: less than 130/80  Based on the patient's last BP of   the patient is: at goal  The plan to reach goal   1.  Reviewed pt's understanding of medications including any barriers to adherence.   2.  Recommended lifestyle modifications: dietary sodium  reduction   3.  The patient understands their clinical goals and will undertake self-management recommendations including:all     4.  Medication Management: no changes made    5.  Referral to Care  Management:: No   6.  Patient Ed/Self-management tools provided: Current self-management tools adequate    PCMH Diabetes Plan    According to current ADA guidelines the patient A1C goal is: less than 7  The patient's last A1C was 03/06/2017: Hemoglobin A1C 7.7 %* (Ref range: 4.0 - 6.0 %), the patient is: above goal.  Diabetic foot exam was performed:  No  The plan to reach goal   1.  Reviewed pt's understanding of medications including barriers to adherence    2.  Recommended lifestyle modifications: weight reduction, diet improvements, glucose monitoring and medication compliance   3.  The patient understands their clinical goals and will undertake self-management recommendations including: all   4.  Medications Management: the following medication changes were made today: glipizide 20 mg daily .   5.  Referral to Care Management:: No   6.  Patient Ed/Self Management tools provided: Current self-management tools adequate      Handouts Given: Patient educational materials distributed by print-out and/or inserted into AVS   Follow-up:   RTC:  2 months for repeat A1c  Author:   Raiford Noble, MD   General Medicine Division  [facility] [id number]  05/08/2017 11:13 AM

## 2017-05-08 NOTE — Patient Instructions (Addendum)
Citalopram- start taking 1/2 a tablet for the next two weeks. Then take 1/2 tablet every other day for the remainder of your prescription.     Glipizide- start taking two tablets once per day for a total of 20 mg.           Plyometrics for the Shoulder    About this topic   Shoulder rehab exercises are important after an injury or surgery. After a surgery, there may be up to 3 phases of exercises for your recovery. Your doctor will make a plan for what exercises you can do and when you can do them.  Phase 3 focuses on advanced strengthening exercises. If you are an athlete, activities specific to your sport with be introduced. Often, plyometric exercises are used. Plyometrics are exercises that use the muscles in short, powerful bursts. You may use weighted balls for these exercises.  General   Before starting with a program, ask your doctor if you are healthy enough to do these exercises. Your doctor may have you work with a trainer or physical therapist to make a safe exercise program to meet your needs.  Strengthening Exercises   Strengthening exercises keep your muscles firm and strong. Start by repeating each exercise 2 to 3 times. Work up to doing each exercise 10 times. Try to do the exercises 2 to 3 times each day. Do all exercises slowly.   Press-ups ? Kneel on both knees. Fall forward onto your hands and lower your chest to the floor. Now, quickly and powerfully push yourself back to the starting position.   Weighted ball throw downs ? Stand with your feet shoulder width apart. Bring the weighted ball over your head. Carefully, but powerfully throw the ball down to the ground. Do not hit your toes.   Weighted ball tosses ? Start with your feet shoulder width apart. Bring the weighted ball to your chest. Have a partner stand at least a body length away from you. With force, pass the ball back and forth.   Weighted ball throwing ? Start with your feet shoulder width apart. Hold the weighted ball in your  dominant hand. Bring the ball into a throwing start position. Have a partner stand at least a body length away from you. With force, throw the ball with one hand to your partner. Catch the ball when your partner throws it back.   Weighted ball drops ? Lie down on the floor.with your arms straight up towards the ceiling. Have a partner stand on a box or step just above your head. The partner will drop the ball towards your hands. Catch the ball with your elbows slightly bent. Lower the ball towards your chest. Immediately straighten the arms and toss it back to your partner.  What will the results be?    Stronger muscles   More toned arms and shoulders   Easier to do daily activities   Quicker return to sports  Helpful tips    Stay active and work out to keep your muscles strong and flexible.   Keep a healthy weight to avoid putting too much stress on your joints. Eat a healthy diet to keep your muscles healthy.   Be sure you do not hold your breath when exercising. This can raise your blood pressure. If you tend to hold your breath, try counting out loud when exercising. If any exercise bothers you, stop right away.   Try walking or swinging your arms at an easy pace for a  few minutes to warm up your muscles. Do this again after exercising.   After exercising, it is a good idea to use ice. Place an ice pack or a bag of frozen peas wrapped in a towel over the painful part. Never put ice right on the skin. Do not leave the ice on more than 10 to 15 minutes at a time. Ice after activity may help decrease pain and swelling. Never ice before stretching.   Doing exercises before a meal may be a good way to get into a routine.   If you are using weights, choose a weight that will allow you to repeat the exercise 10 times before resting. If you easily do 10 repeats, you may not be using enough weight. If you are not able to do 10 repeats, you are using too heavy of a weight.   Exercise may be slightly  uncomfortable, but you should not have sharp pains. If you do get sharp pains, stop what you are doing. If the sharp pains continue, call your doctor.  Where can I learn more?   [employer]  http://orthoinfo.aaos.org/topic.cfm?topic=A00067   Last Reviewed Date   2013-09-10  Consumer Information Use and Disclaimer   This information is not specific medical advice and does not replace information you receive from your health care provider. This is only a brief summary of general information. It does NOT include all information about conditions, illnesses, injuries, tests, procedures, treatments, therapies, discharge instructions or life-style choices that may apply to you. You must talk with your health care provider for complete information about your health and treatment options. This information should not be used to decide whether or not to accept your health care providers advice, instructions or recommendations. Only your health care provider has the knowledge and training to provide advice that is right for you.  Copyright   Copyright  2015 [facility] [employer] and its affiliates and/or licensors. All rights reserved.          Rotator Cuff Tendinitis Stretching Exercises    About this topic   The rotator cuff is a group of muscles and tendons in your shoulder. It helps your shoulder move and be steady. These muscles help to rotate and lift the arm. Tendons are strong bands that connect muscles to bones. Tendonitis happens when the tendon becomes swollen and inflamed. Exercises can help make this problem better. If you have this problem, it may be helpful to see a physical therapist. A therapist can tell you what exercises are best for you.  General   Before starting with a program, ask your doctor if you are healthy enough to do these exercises. Your doctor may have you work with a trainer or physical therapist to make a safe exercise program to meet your  needs.  Passive Exercises   You use the movement of your body to move your arm. Do NOT use your shoulder muscles to move your arm. Hold onto a table with one hand and bend forward at the waist until your back is level with the table. Let your sore arm hang in front of you. Do each exercise for 1 minute.   Pendulum exercises:   Circles ? Move your body in large circles one way. After you move a few times, your arm should start gently moving in circles. Now, move your body in circles the other way until your arm moves the other way.   Swinging side-to-side ? Move  your body side to side. After you move a few times, your arm should start gently moving side-to-side.   Swinging back and forth ? Move your body forwards and then backwards. After you move a few times, your arm should start gently moving back and forth.  Stretching Exercises   Stretching exercises keep your muscles flexible. They also stop them from getting tight. Start by doing each of these stretches 2 to 3 times. In order for your body to make changes, you will need to hold these stretches for 20 to 30 seconds. Try to do the stretches 2 to 3 times each day. Do all exercises slowly.   Table stretches ? Sitting on an office chair with wheels works well for these exercises. Sit about a foot away from the table or desk.   Forward ? Put your arm on the table. Keep your elbow straight and lean forward until you feel a stretch. Now, push the chair backwards to get more of a stretch.    Sideways ? Turn the chair sideways. Put your arm on the table. Make sure your thumb is pointed up. Keep your elbow straight and lean forward until you feel a stretch. Now, push the chair backwards to get more of a stretch.    Bent arm rotations ? Turn the chair sideways. Put your lower arm on the table with your elbow bent in a 90 degree angle. Lean forward until you feel a stretch.   Back shoulder stretches ? Stand up straight with legs wide apart. Cross your arm across  your body at shoulder level. Using the other arm, pull the crossed arm towards the other shoulder. Repeat using the other arm.   Internal rotation towel stretches ? To stretch your left shoulder, put your left hand behind your back at your waist. Toss a towel over your right shoulder. Grab the bottom of the towel with your left hand. Gently, pull the towel up with your right hand, allowing your left hand to slide up your spine. Pull until you feel a good stretch at the front of your shoulder. Switch hands to stretch your right shoulder.    Cane exercises:   Overhead flexions ? Lie down and grab the cane with both hands. Start with your arms straight and the cane resting on your upper legs. Keep your arms straight and lift the cane over your head.   Abductions or side-to-side motion ? Stand and grab the cane with both hands. Turn your thumbs to the left to stretch your left shoulder. Start with your arms straight and the cane resting on your upper legs. Push the cane to the left, raising your left arm. Keep your left elbow straight. Keep pushing until you feel a good stretch. Switch the position of your hands to point your thumbs to the right and push the cane right to stretch your right shoulder.    External rotations or side-to-side rotations ? Lie down and grab the cane with both hands. Start with your elbows bent and touching your body. Put the tip of the cane in the palm of your right hand to stretch your right shoulder. Grab the cane at the other end with your left hand. Push the cane sideways into your right palm until you feel a stretch in your shoulder. Be sure to keep your right elbow close to your body while you are stretching. Switch hands to stretch the left shoulder.  What will the results be?    Less pain and stiffness   Better range of motion   Less swelling   Increased strength   Easier to do daily activities  Helpful tips    Avoid activities that repeatedly use your shoulder in  the same way which may cause your shoulder pain to worsen.   Stay active and work out to keep your muscles strong and flexible.   Eat a healthy diet to keep your muscles healthy.   Be sure you do not hold your breath when exercising. This can raise your blood pressure. If you tend to hold your breath, try counting out loud when exercising. If any exercise bothers you, stop right away.   Always warm up before stretching. Heated muscles stretch much easier than cool muscles. Stretching cool muscles can lead to injury.   Try walking and swinging your arms at an easy pace for a few minutes to warm up your muscles. Do this again after exercising.   Never bounce when doing stretches.   After exercising, it is a good idea to use ice. Place an ice pack or a bag of frozen peas wrapped in a towel over the painful part. Never put ice right on the skin. Do not leave the ice on more than 10 to 15 minutes at a time. Ice after activity may help decrease pain and swelling. Never ice before stretching.   Doing exercises before a meal may be a good way to get into a routine.   Exercise may be slightly uncomfortable, but you should not have sharp pains. If you do get sharp pains, stop what you are doing. If the sharp pains continue, call your doctor.  Where can I learn more?   American Academy [employer]geons  http://orthoinfo.aaos.org/topic.cfm?topic=A00663   Last Reviewed Date   2013-10-07  Consumer Information Use and Disclaimer   This information is not specific medical advice and does not replace information you receive from your health care provider. This is only a brief summary of general information. It does NOT include all information about conditions, illnesses, injuries, tests, procedures, treatments, therapies, discharge instructions or life-style choices that may apply to you. You must talk with your health care provider for complete information about your health and treatment options. This information  should not be used to decide whether or not to accept your health care providers advice, instructions or recommendations. Only your health care provider has the knowledge and training to provide advice that is right for you.  Copyright   Copyright  2015 [facility] Clinical [employer] Information, Inc. and its affiliates and/or licensors. All rights reserved.

## 2017-06-14 ENCOUNTER — Other Ambulatory Visit: Payer: Self-pay | Admitting: Obstetrics and Gynecology

## 2017-07-09 ENCOUNTER — Ambulatory Visit: Payer: BLUE CROSS/BLUE SHIELD | Attending: Internal Medicine | Admitting: Internal Medicine

## 2017-07-09 ENCOUNTER — Encounter: Payer: Self-pay | Admitting: Internal Medicine

## 2017-07-09 VITALS — BP 110/46 | HR 90 | Temp 97.3°F | Ht 69.02 in | Wt 219.0 lb

## 2017-07-09 DIAGNOSIS — E785 Hyperlipidemia, unspecified: Secondary | ICD-10-CM | POA: Insufficient documentation

## 2017-07-09 DIAGNOSIS — E119 Type 2 diabetes mellitus without complications: Secondary | ICD-10-CM | POA: Insufficient documentation

## 2017-07-09 DIAGNOSIS — F419 Anxiety disorder, unspecified: Secondary | ICD-10-CM | POA: Insufficient documentation

## 2017-07-09 LAB — POCT HEMOGLOBIN A1C: Hemoglobin A1C,POC: 8.1 % — ABNORMAL HIGH

## 2017-07-09 MED ORDER — INSULIN PEN NEEDLE 31G X 5 MM MISC *A*
5 refills | Status: DC
Start: 2017-07-09 — End: 2018-09-27

## 2017-07-09 MED ORDER — LIRAGLUTIDE 18 MG/3ML SC SOPN *I*
PEN_INJECTOR | SUBCUTANEOUS | 0 refills | Status: DC
Start: 2017-07-09 — End: 2017-08-07

## 2017-07-09 MED ORDER — METFORMIN HCL 750 MG PO TB24 *I*
1500.0000 mg | ORAL_TABLET | Freq: Every day | ORAL | 5 refills | Status: DC
Start: 2017-07-09 — End: 2017-10-24

## 2017-07-09 MED ORDER — ALCOHOL SWABS PADS *I*
MEDICATED_PAD | 5 refills | Status: AC
Start: 2017-07-09 — End: ?

## 2017-07-09 NOTE — Progress Notes (Signed)
[facility] Follow-up Note     ReasonReason For Visit:   Follow-up      HPI:      Dale Schultz is 41 y.o. year old male coming in to discuss the following issues:    DM- last A1c was 7.7 in July. He is still taking the glipizide 20 mg daily as well as metformin. Notes that his BG in the morning about 200. He notes that he will sometimes note that his BG will be less than 100 during around mid 80s. This usually happens when he has not eaten or snacked much during. He knows he needs to start exercising again. Notes some diarrhea, he is interested in extended release metformin.     HLD- taking the atorvastatin     Anxiety- he has been taking 1/2 pills of the Celexa since last met. He is still interested in coming off of this medication. Denies increase in anxiety sxs off of this medication.     Past Medical History:   Diagnosis Date    Anxiety     Diabetes mellitus     HLD (hyperlipidemia)          Review of Systems:     Review of Systems   Constitutional: Negative for chills, diaphoresis, fever, malaise/fatigue and weight loss.   HENT: Negative for hearing loss and tinnitus.    Eyes: Negative for blurred vision and double vision.   Respiratory: Negative for cough, shortness of breath and wheezing.    Cardiovascular: Negative for chest pain, palpitations and leg swelling.   Gastrointestinal: Negative for abdominal pain, blood in stool, constipation, diarrhea, heartburn, nausea and vomiting.   Genitourinary: Negative for dysuria, frequency, hematuria and urgency.   Musculoskeletal: Negative for back pain, joint pain, myalgias and neck pain.   Skin: Negative for itching and rash.   Neurological: Negative for dizziness, tingling, weakness and headaches.   Endo/Heme/Allergies: Does not bruise/bleed easily.   Psychiatric/Behavioral: Negative for depression and substance abuse. The patient is not nervous/anxious and does not have insomnia.         Medications/Allergies/Immunizations:          Medication list reviewed and reconciled this visit in the EMR. Allergy list confirmed in the EMR.    Current Outpatient Prescriptions   Medication Sig    citalopram (CELEXA) 20 MG tablet TAKE 1 TABLET BY MOUTH EVERY DAY    glipiZIDE (GLUCOTROL) 10 MG 24 hr tablet Take 2 tablets (20 mg total) by mouth daily (with breakfast)   SWALLOW WHOLE, DO NOT BREAK,CRUSH OR CHEW    atorvastatin (LIPITOR) 20 MG tablet TAKE 1 TABLET BY MOUTH EVERY DAY    metFORMIN (GLUCOPHAGE-XR) 750 MG 24 hr tablet Take 2 tablets (1,500 mg total) by mouth daily (with breakfast)   Swallow whole. Do not crush, break, or chew.    liraglutide (VICTOZA) 18 MG/3ML injection Give 0.6 mg once daily for 1 week, then increase to 1.2 mg once daily    insulin pen needle (BD ULTRA-FINE PEN NEEDLE MINI) 31G X 5 MM Use as directed.    alcohol swabs pads Use as directed    BAYER CONTOUR test strip tes 2-3 times daily as needed for 250.02    LANCETS ULTRA THIN MISC Test 3 times daily for 250.02         Past Medical History/Family History/Social History:     Past Medical History, Family History, and Social History reviewed, confirmed, and updated as appropriate this  visit in the EMR.     Physical Exam:     Vitals:    07/09/17 1054   BP: (!) 110/46   Cuff Size: adult   Pulse: 90   Temp: 36.3 C (97.3 F)   TempSrc: Temporal   Weight: 99.3 kg (219 lb)   Height: 1.753 m (5' 9.02")       BP Readings from Last 3 Encounters:   07/09/17 (!) 110/46   05/08/17 130/84   03/06/17 136/84     Wt Readings from Last 3 Encounters:   07/09/17 99.3 kg (219 lb)   05/08/17 98.8 kg (217 lb 12.8 oz)   03/06/17 100.6 kg (221 lb 11.2 oz)       Vitals Signs: ZHY:QMVH mass index is 32.33 kg/m., otherwise as above, reviewed with patient    General: In no apparent distress. Pleasant  HEENT: Eyes- PERRL. Throat- No exudates.  Neck:  No LAD.  Pulmonary: CTA B/L. No wheezes, rhonchi, or rales.  Cardiovascular: No JVD, No carotid bruits. RRR. No murmurs, rubs, or gallops. No pedal  edema.  Abdominal: Soft. Non-distended. Non-tender. Bowel sounds present.  Neuro: AAOx3, no focal deficits  Psych: Affect appropriate. Speech is not pressured.     Labs and Imaging:   Labs and Imaging results reviewed with patient including elevated A1c to 8.1 up from prior.       Assessment and Plan:     Rahil was seen today for follow-up.    Diagnoses and all orders for this visit:    Type 2 diabetes mellitus without complication, without long-term current use of insulin  - change the immediate release to metFORMIN (GLUCOPHAGE-XR) 750 MG 24 hr tablet; Take 2 tablets (1,500 mg total) by mouth daily (with breakfast)   Swallow whole. Do not crush, break, or chew.  -     POCT HEMOGLOBIN A1C  -    Starting  liraglutide (VICTOZA) 18 MG/3ML injection; Give 0.6 mg once daily for 1 week, then increase to 1.2 mg once daily  -     insulin pen needle (BD ULTRA-FINE PEN NEEDLE MINI) 31G X 5 MM; Use as directed.  -     alcohol swabs pads; Use as directed    Hyperlipidemia, unspecified hyperlipidemia type  - continue atorvastatin 20 mg     Anxiety  - will wean completely off of the celexa. 10 mg every other day for 1 week then off.     Already had flu vaccine today.       [facility]:   [facility] Hypertension Plan    Based on this patients clinical history and according to [place] guidelines target BP goal is: less than 130/80  Based on the patient's last BP of BP: (!) 110/46 the patient is: at goal  The plan to reach goal   1.  Reviewed pt's understanding of medications including any barriers to adherence.   2.  Recommended lifestyle modifications: weight reduction and dietary sodium reduction   3.  The patient understands their clinical goals and will undertake self-management recommendations including:all     4.  Medication Management: N/A diet only    5.  Referral to Care Management:: No   6.  Patient Ed/Self-management tools provided: Current self-management tools adequate    [facility] Diabetes Plan    According to current ADA guidelines the  patient A1C goal is: less than 7  The patient's last A1C was 03/06/2017: Hemoglobin A1C 7.7 %* (Ref range: 4.0 - 6.0 %), the patient is:  above goal.  Diabetic foot exam was performed:  No  The plan to reach goal   1.  Reviewed pt's understanding of medications including barriers to adherence    2.  Recommended lifestyle modifications: weight reduction, exercise plan, diet improvements, glucose monitoring and medication compliance   3.  The patient understands their clinical goals and will undertake self-management recommendations including: all   4.  Medications Management: the following medication changes were made today: startng Victoza 0.6 mg daily, change metformin to XR given GI sxs.   5.  Referral to Care Management:: No   6.  Patient Ed/Self Management tools provided: Current self-management tools adequate      Handouts Given: Patient educational materials distributed by print-out and/or inserted into AVS   Follow-up:   RTC:  3 months     Author:   Chinita Greenland, MD   General Medicine Division  [facility] [id number]  07/09/2017 11:09 AM

## 2017-07-09 NOTE — Progress Notes (Signed)
Victoza teaching done with patient while in office using demo victoza pen and injectable cushion.   Reviewed set up of pen, cleaning pen and site, injection technique, rotating sites.   Pt verbalized understanding and able to return demo using demo device.   Provided pt with written education for reinforcement at home.

## 2017-08-07 ENCOUNTER — Other Ambulatory Visit: Payer: Self-pay | Admitting: Internal Medicine

## 2017-10-09 ENCOUNTER — Ambulatory Visit: Payer: BLUE CROSS/BLUE SHIELD | Attending: Internal Medicine | Admitting: Internal Medicine

## 2017-10-09 ENCOUNTER — Encounter: Payer: Self-pay | Admitting: Internal Medicine

## 2017-10-09 VITALS — BP 128/66 | HR 68 | Temp 97.0°F | Ht 69.02 in | Wt 221.0 lb

## 2017-10-09 DIAGNOSIS — E119 Type 2 diabetes mellitus without complications: Secondary | ICD-10-CM

## 2017-10-09 DIAGNOSIS — F419 Anxiety disorder, unspecified: Secondary | ICD-10-CM | POA: Insufficient documentation

## 2017-10-09 DIAGNOSIS — E785 Hyperlipidemia, unspecified: Secondary | ICD-10-CM | POA: Insufficient documentation

## 2017-10-09 LAB — POCT HEMOGLOBIN A1C: Hemoglobin A1C,POC: 6.9 % — ABNORMAL HIGH

## 2017-10-09 MED ORDER — CITALOPRAM HYDROBROMIDE 20 MG PO TABS *I*
20.0000 mg | ORAL_TABLET | Freq: Every day | ORAL | 5 refills | Status: DC
Start: 2017-10-09 — End: 2018-07-09

## 2017-10-09 MED ORDER — ATORVASTATIN CALCIUM 20 MG PO TABS *I*
20.0000 mg | ORAL_TABLET | Freq: Every day | ORAL | 5 refills | Status: DC
Start: 2017-10-09 — End: 2018-04-03

## 2017-10-09 MED ORDER — GLIPIZIDE 10 MG PO TB24 *I*
10.0000 mg | ORAL_TABLET | Freq: Every day | ORAL | 5 refills | Status: DC
Start: 2017-10-09 — End: 2017-12-14

## 2017-10-09 NOTE — Progress Notes (Signed)
[facility] Follow-up Note     ReasonReason For Visit:   Diabetes and Follow-up      HPI:      Dale Schultz is 42 y.o. year old male coming in to discuss the following issues:    Type 2 DM- he is taking the Victoza, metformin, glipizide as well. He has noted some low BG in the 70s. This has started since taking the victoza. This will happen at 5 in the afternoon. He is taking the glipizide. He is taking Victoza 1.2. Tolerating the metformin with the XR formulations. His appetite has been up and done recently.      Anxiety- he had stopped thwe Celexa, but his anxiety worsened so he started taking this again recently (20 mg daily). He was having a lot of overwhelmed feeling, mortality thoughts. No suicidal thoughts. He is interested in potentially finding therapist.     HLD- he is still on the statin. Tolerating at this time.     Past Medical History:   Diagnosis Date    Anxiety     Diabetes mellitus     HLD (hyperlipidemia)          Review of Systems:     Review of Systems   Constitutional: Negative for chills, diaphoresis, fever, malaise/fatigue and weight loss.   HENT: Negative for hearing loss and tinnitus.    Eyes: Negative for blurred vision and double vision.   Respiratory: Negative for cough, shortness of breath and wheezing.    Cardiovascular: Negative for chest pain, palpitations and leg swelling.   Gastrointestinal: Positive for nausea. Negative for abdominal pain, constipation, diarrhea, heartburn and vomiting.   Genitourinary: Negative for dysuria, frequency, hematuria and urgency.   Musculoskeletal: Negative for back pain, joint pain, myalgias and neck pain.   Skin: Negative for itching and rash.   Neurological: Negative for dizziness and headaches.   Endo/Heme/Allergies: Does not bruise/bleed easily.   Psychiatric/Behavioral: Negative for depression and suicidal ideas. The patient is nervous/anxious. The patient does not have insomnia.              Medications/Allergies/Immunizations:     Medication list reviewed and reconciled this visit in the EMR. Allergy list confirmed in the EMR.    Current Outpatient Prescriptions   Medication Sig    citalopram (CELEXA) 20 MG tablet Take 1 tablet (20 mg total) by mouth daily    glipiZIDE (GLUCOTROL) 10 MG 24 hr tablet Take 1 tablet (10 mg total) by mouth daily (with breakfast)   SWALLOW WHOLE, DO NOT BREAK,CRUSH OR CHEW    atorvastatin (LIPITOR) 20 MG tablet Take 1 tablet (20 mg total) by mouth daily    liraglutide (VICTOZA) 18 MG/3ML injection Inject 1.2 mg into the skin daily    metFORMIN (GLUCOPHAGE-XR) 750 MG 24 hr tablet Take 2 tablets (1,500 mg total) by mouth daily (with breakfast)   Swallow whole. Do not crush, break, or chew.    insulin pen needle (BD ULTRA-FINE PEN NEEDLE MINI) 31G X 5 MM Use as directed.    alcohol swabs pads Use as directed    BAYER CONTOUR test strip tes 2-3 times daily as needed for 250.02    LANCETS ULTRA THIN MISC Test 3 times daily for 250.02         Past Medical History/Family History/Social History:     Past Medical History, Family History, and Social History reviewed, confirmed, and updated as appropriate this visit in the EMR.     Physical  Exam:     Vitals:    10/09/17 1147   BP: 128/66   Pulse: 68   Temp: 36.1 C (97 F)   TempSrc: Temporal   Weight: 100.2 kg (221 lb)   Height: 1.753 m (5' 9.02")       BP Readings from Last 3 Encounters:   10/09/17 128/66   07/09/17 (!) 110/46   05/08/17 130/84     Wt Readings from Last 3 Encounters:   10/09/17 100.2 kg (221 lb)   07/09/17 99.3 kg (219 lb)   05/08/17 98.8 kg (217 lb 12.8 oz)       Vitals Signs: XNA:TFTDBMI:Body mass index is 32.62 kg/m., otherwise as above, reviewed with patient    General: In no apparent distress. Pleasant  HEENT: Eyes- PERRL. Throat- No exudates. TM clear bilaterally.   Neck:  No LAD.  Pulmonary: CTA B/L. No wheezes, rhonchi, or rales.  Cardiovascular: . RRR. No murmurs, rubs, or gallops. No pedal  edema.  Abdominal: Soft. Non-distended. Non-tender. Bowel sounds present.     Labs and Imaging:   Labs and Imaging results reviewed with patient, including improved A1c to 6.9 from 8.4.       Assessment and Plan:     Dale Schultz was seen today for diabetes and follow-up.    Diagnoses and all orders for this visit:    Type 2 diabetes mellitus without complication, without long-term current use of insulin  -     POCT Hemoglobin A1C  -     glipiZIDE (GLUCOTROL) 10 MG 24 hr tablet; Take 1 tablet (10 mg total) by mouth daily (with breakfast)   SWALLOW WHOLE, DO NOT BREAK,CRUSH OR CHEW  - given numerous hypoglycemic episodes will decrease the glipizide to 10 mg daily   - will plan to repeat A1c in 3 months, and if above goal will increase the victoza and continue glipizide and metfromin.   - if A1c still at goal, will plan to discontinue the glipizide and increase the Victoza at that time.     Anxiety  -     He has resumed citalopram (CELEXA) 20 MG tablet; Take 1 tablet (20 mg total) by mouth daily  - given names and numbers to establish with therapist in the area.     Hyperlipidemia, unspecified hyperlipidemia type  - tolerating current dose of statin at this time, his lipid panel had a good response.     Other orders  -     POCT HEMOGLOBIN A1C        [facility]:     [facility] Diabetes Plan    According to current ADA guidelines the patient A1C goal is: less than 7  The patient's last A1C was 03/06/2017: Hemoglobin A1C 6.9 %* (Ref range: 4.0 - 6.0 %), the patient is: at goal.  Diabetic foot exam was performed:  No  The plan to reach goal   1.  Reviewed pt's understanding of medications including barriers to adherence    2.  Recommended lifestyle modifications: weight reduction, diet improvements, glucose monitoring and medication compliance   3.  The patient understands their clinical goals and will undertake self-management recommendations including: all   4.  Medications Management: the following medication changes were made today:  decrease glipizide to 10 mg daily .   5.  Referral to Care Management:: No   6.  Patient Ed/Self Management tools provided: Current self-management tools adequate      Handouts Given: Patient educational materials distributed by print-out and/or inserted  into AVS   Follow-up:   RTC:  3 months for repeat A1c     Author:   Raiford Noble, MD   General Medicine Division  [facility] [id number]  10/09/2017 11:43 AM

## 2017-10-09 NOTE — Patient Instructions (Signed)
Here are the names and phone numbers of several mental health centers in the area--please call to arrange an appointment.    [facility] [address]  [phone]    [facility] [address]      [phone]    [facility]  [address]    [phone]    [facility] [address]     [phone]    [facility]     [phone]

## 2017-10-24 ENCOUNTER — Other Ambulatory Visit: Payer: Self-pay | Admitting: Internal Medicine

## 2017-11-19 ENCOUNTER — Encounter: Payer: Self-pay | Admitting: Internal Medicine

## 2017-11-19 DIAGNOSIS — B9689 Other specified bacterial agents as the cause of diseases classified elsewhere: Secondary | ICD-10-CM

## 2017-11-19 DIAGNOSIS — J019 Acute sinusitis, unspecified: Secondary | ICD-10-CM

## 2017-11-20 MED ORDER — AZITHROMYCIN 250 MG PO TABS *I*
ORAL_TABLET | ORAL | 0 refills | Status: AC
Start: 2017-11-20 — End: 2017-11-25

## 2017-12-02 ENCOUNTER — Encounter: Payer: Self-pay | Admitting: Internal Medicine

## 2017-12-02 DIAGNOSIS — E119 Type 2 diabetes mellitus without complications: Secondary | ICD-10-CM

## 2017-12-03 MED ORDER — METFORMIN HCL 750 MG PO TB24 *I*
1500.0000 mg | ORAL_TABLET | Freq: Every day | ORAL | 5 refills | Status: DC
Start: 2017-12-03 — End: 2018-05-27

## 2017-12-14 ENCOUNTER — Encounter: Payer: Self-pay | Admitting: Internal Medicine

## 2017-12-14 DIAGNOSIS — E119 Type 2 diabetes mellitus without complications: Secondary | ICD-10-CM

## 2017-12-14 MED ORDER — GLIPIZIDE 10 MG PO TB24 *I*
10.0000 mg | ORAL_TABLET | Freq: Every day | ORAL | 5 refills | Status: DC
Start: 2017-12-14 — End: 2018-10-11

## 2018-01-05 ENCOUNTER — Other Ambulatory Visit: Payer: Self-pay | Admitting: Internal Medicine

## 2018-01-07 ENCOUNTER — Ambulatory Visit: Payer: BLUE CROSS/BLUE SHIELD | Attending: Internal Medicine | Admitting: Internal Medicine

## 2018-01-07 ENCOUNTER — Encounter: Payer: Self-pay | Admitting: Internal Medicine

## 2018-01-07 VITALS — BP 139/94 | HR 72 | Temp 96.3°F | Ht 69.0 in | Wt 220.0 lb

## 2018-01-07 DIAGNOSIS — I10 Essential (primary) hypertension: Secondary | ICD-10-CM | POA: Insufficient documentation

## 2018-01-07 DIAGNOSIS — E785 Hyperlipidemia, unspecified: Secondary | ICD-10-CM | POA: Insufficient documentation

## 2018-01-07 DIAGNOSIS — F419 Anxiety disorder, unspecified: Secondary | ICD-10-CM | POA: Insufficient documentation

## 2018-01-07 DIAGNOSIS — E119 Type 2 diabetes mellitus without complications: Secondary | ICD-10-CM | POA: Insufficient documentation

## 2018-01-07 LAB — POCT HEMOGLOBIN A1C: Hemoglobin A1C,POC: 8.5 % — ABNORMAL HIGH

## 2018-01-07 MED ORDER — LIRAGLUTIDE 18 MG/3ML SC SOPN *I*
1.8000 mg | PEN_INJECTOR | Freq: Every day | SUBCUTANEOUS | 5 refills | Status: DC
Start: 2018-01-07 — End: 2018-07-01

## 2018-01-07 NOTE — Progress Notes (Signed)
[facility] Follow-up Note     ReasonReason For Visit:   Follow-up    HPI:      Dale Schultz is 42 y.o. year old male coming in to discuss the following issues:    Type 2 DM- last A1c was 6.9 in Feb. Still on metformin, Victoza 1.2 mg, and glipizide 10 mg daily. He notes he is feeling well overall. AM BG usually around 100 but he will get 200 in the afternoon or after dinner. Tolerating the XR dose of metformin better than the short acting formulation.     HLD- stable on atorvastatin and tolerating this well.     Elevated BP- notes that his BP was high today, but has been better controled at prior visits. Denies CP , SOB, visual changes.     Anxiety- celexa 20 mg. She is meeting with [name] in [place] for his anxiety. He has been stable on his current medication, Biweekly. Notes that overall his anxiety is stable.    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     Past Medical History:   Diagnosis Date    Anxiety     Diabetes mellitus (HCC)     HLD (hyperlipidemia)          Review of Systems:     Review of Systems   Constitutional: Negative for chills, diaphoresis, fever, malaise/fatigue and weight loss.   HENT: Negative for hearing loss and tinnitus.    Eyes: Negative for blurred vision and double vision.   Respiratory: Negative for cough, shortness of breath and wheezing.    Cardiovascular: Negative for chest pain, palpitations and leg swelling.   Gastrointestinal: Negative for abdominal pain, diarrhea, heartburn, nausea and vomiting.   Genitourinary: Negative for dysuria, frequency, hematuria and urgency.   Musculoskeletal: Negative for back pain, joint pain, myalgias and neck pain.   Skin: Negative for itching and rash.   Neurological: Negative for dizziness and headaches.   Endo/Heme/Allergies: Does not bruise/bleed easily.   Psychiatric/Behavioral: Negative for depression. The patient is not nervous/anxious and does not have insomnia.          Medications/Allergies/Immunizations:     Medication list reviewed and reconciled this visit in the EMR. Allergy list confirmed in the EMR.    Current Outpatient Prescriptions   Medication Sig    liraglutide (VICTOZA) 18 MG/3ML injection Inject 1.8 mg into the skin daily    glipiZIDE (GLUCOTROL) 10 MG 24 hr tablet Take 1 tablet (10 mg total) by mouth daily (with breakfast)   SWALLOW WHOLE, DO NOT BREAK,CRUSH OR CHEW    metFORMIN (GLUCOPHAGE-XR) 750 MG 24 hr tablet Take 2 tablets (1,500 mg total) by mouth daily (with breakfast)   Swallow whole. Do not crush, break, or chew.    citalopram (CELEXA) 20 MG tablet Take 1 tablet (20 mg total) by mouth daily    atorvastatin (LIPITOR) 20 MG tablet Take 1 tablet (20 mg total) by mouth daily    insulin pen needle (BD ULTRA-FINE PEN NEEDLE MINI) 31G X 5 MM Use as directed.    alcohol swabs pads Use as directed    BAYER CONTOUR test strip tes 2-3 times daily as needed for 250.02    LANCETS ULTRA THIN MISC Test 3 times daily for 250.02         Past Medical History/Family History/Social History:     Past Medical History, Family History, and Social History reviewed, confirmed, and updated as appropriate this visit  in the EMR.     Physical Exam:     Vitals:    01/07/18 1107   BP: (!) 139/94   BP Location: Left arm   Patient Position: Sitting   Pulse: 72   Temp: 35.7 C (96.3 F)   TempSrc: Temporal   Weight: 99.8 kg (220 lb)   Height: 1.753 m ( )       BP Readings from Last 3 Encounters:   01/07/18 (!) 139/94   10/09/17 128/66   07/09/17 (!) 110/46     Wt Readings from Last 3 Encounters:   01/07/18 99.8 kg (220 lb)   10/09/17 100.2 kg (221 lb)   07/09/17 99.3 kg (219 lb)       Vitals Signs: ZOX:WRUE mass index is 32.49 kg/m., otherwise as above, reviewed with patient    General: In no apparent distress. Pleasant  HEENT: Eyes- PERRL. Throat- No exudates.  Neck:  No LAD.  Pulmonary: CTA B/L. No wheezes, rhonchi, or rales.  Cardiovascular: RRR. No murmurs, rubs, or  gallops. No pedal edema.  Abdominal: Soft. Non-distended. Non-tender. Bowel sounds present.    Labs and Imaging:   Labs and Imaging results reviewed with patient including elevated A1c to 8.5 up from 6.9.      Assessment and Plan:     [name] was seen today for follow-up.    Diagnoses and all orders for this visit:    Type 2 diabetes mellitus without complication, without long-term current use of insulin  -     Increase liraglutide (VICTOZA) 18 MG/3ML injection; Inject 1.8 mg into the skin daily  - continue glipizide XR 10 mg  Daily  - increase metformin XR to 2250 mg daily     Essential hypertension  - above goal today, his readings previously had been well controlled. Will monitor at follow up and consider initiation of ACEi at follow up visit with Type 2 DM    HLD  - stable on statin therapy.     Anxiety  - continue celexa, his sxs have been stable back on this medciation.     Other orders  -     POCT HEMOGLOBIN A1C        Handouts Given: Patient educational materials distributed by print-out and/or inserted into AVS   Follow-up:   RTC:  3 month follow up, repeat A1c and BP at follow up.     Author:   Raiford Noble, MD   General Medicine Division  [facility] [phone]  01/07/2018 11:08 AM

## 2018-04-03 ENCOUNTER — Other Ambulatory Visit: Payer: Self-pay | Admitting: Internal Medicine

## 2018-04-05 ENCOUNTER — Ambulatory Visit: Payer: BLUE CROSS/BLUE SHIELD | Attending: Internal Medicine | Admitting: Internal Medicine

## 2018-04-05 ENCOUNTER — Encounter: Payer: Self-pay | Admitting: Internal Medicine

## 2018-04-05 VITALS — BP 128/80 | HR 74 | Temp 97.9°F | Ht 69.0 in | Wt 212.4 lb

## 2018-04-05 DIAGNOSIS — I10 Essential (primary) hypertension: Secondary | ICD-10-CM | POA: Insufficient documentation

## 2018-04-05 DIAGNOSIS — E119 Type 2 diabetes mellitus without complications: Secondary | ICD-10-CM | POA: Insufficient documentation

## 2018-04-05 DIAGNOSIS — F419 Anxiety disorder, unspecified: Secondary | ICD-10-CM

## 2018-04-05 DIAGNOSIS — E785 Hyperlipidemia, unspecified: Secondary | ICD-10-CM | POA: Insufficient documentation

## 2018-04-05 LAB — POCT HEMOGLOBIN A1C: Hemoglobin A1C,POC: 7 % — ABNORMAL HIGH

## 2018-04-05 NOTE — Progress Notes (Signed)
[facility] Follow-up Note     ReasonReason For Visit:   Diabetes and Hypertension    HPI:      Dale Schultz is 42 y.o. year old male coming in to discuss the following issues:    Type 2 DM- he has been adherent to his glipizide, Victoza, and metformin. He has lost weight since his last visit, and he has changed his diet. Just came back from vacation. He feels well overall.     HTN- his BP was better controlled today with there lifetsyle modifications he made and weight loss.    HLD- he is stable on statin, tolerating well     Anxiety- mood has been good on Celexa. 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 He is follow with [facility].     Past Medical History:   Diagnosis Date    Anxiety     Diabetes mellitus     HLD (hyperlipidemia)          Review of Systems:     ROS     Medications/Allergies/Immunizations:     Medication list reviewed and reconciled this visit in the EMR. Allergy list confirmed in the EMR.    Current Outpatient Prescriptions   Medication Sig    liraglutide (VICTOZA) 18 MG/3ML injection Inject 1.8 mg into the skin daily    glipiZIDE (GLUCOTROL) 10 MG 24 hr tablet Take 1 tablet (10 mg total) by mouth daily (with breakfast)   SWALLOW WHOLE, DO NOT BREAK,CRUSH OR CHEW    metFORMIN (GLUCOPHAGE-XR) 750 MG 24 hr tablet Take 2 tablets (1,500 mg total) by mouth daily (with breakfast)   Swallow whole. Do not crush, break, or chew.    citalopram (CELEXA) 20 MG tablet Take 1 tablet (20 mg total) by mouth daily    atorvastatin (LIPITOR) 20 MG tablet Take 1 tablet (20 mg total) by mouth daily    insulin pen needle (BD ULTRA-FINE PEN NEEDLE MINI) 31G X 5 MM Use as directed.    alcohol swabs pads Use as directed    BAYER CONTOUR test strip tes 2-3 times daily as needed for 250.02    LANCETS ULTRA THIN MISC Test 3 times daily for 250.02         Past Medical History/Family History/Social History:     Past Medical History, Family History, and  Social History reviewed, confirmed, and updated as appropriate this visit in the EMR.     Physical Exam:     Vitals:    04/05/18 1139   BP: 128/80   BP Location: Left arm   Patient Position: Sitting   Cuff Size: large adult   Pulse: 74   Temp: 36.6 C (97.9 F)   TempSrc: Temporal   Weight: 96.3 kg (212 lb 6.4 oz)   Height: 1.753 m (5\' 9" )       BP Readings from Last 3 Encounters:   04/05/18 128/80   01/07/18 (!) 139/94   10/09/17 128/66     Wt Readings from Last 3 Encounters:   04/05/18 96.3 kg (212 lb 6.4 oz)   01/07/18 99.8 kg (220 lb)   10/09/17 100.2 kg (221 lb)       Vitals Signs: UJW:JXBJBMI:Body mass index is 31.37 kg/m., otherwise as above, reviewed with patient    General: In no apparent distress. Pleasant  HEENT: Eyes- PERRL. Throat- No exudates.  Neck:  No LAD.  Pulmonary: CTA B/L. No wheezes, rhonchi, or  rales.  Cardiovascular: No JVD, No carotid bruits. RRR. No murmurs, rubs, or gallops. No pedal edema.  Abdominal: Soft. Non-distended. Non-tender. Bowel sounds present.  Skin: No wounds on feet.  Neuro: AAOx3, no focal deficits  Psych: Affect appropriate. Speech is not pressured.     Labs and Imaging:   Labs and Imaging results reviewed with patient. A1c with significant improvement to 7 from 8.5.       Assessment and Plan:     Dale Schultz was seen today for diabetes and hypertension.    Diagnoses and all orders for this visit:    Type 2 diabetes mellitus  -     POCT Hemoglobin A1C  -A1c improved, commended on lifestyle changes  - will continue glipizide, metformin and victoza at current dose    Essential hypertension  - BP well controlled today with lifestyle changes     Hyperlipidemia, unspecified hyperlipidemia type  - continue statin     Anxiety  - sxs stable on Celexa    Handouts Given: Patient educational materials distributed by print-out and/or inserted into AVS   Follow-up:   RTC:  3 months to assure stability with A1c on current regimen    Author:   Raiford NobleStewart Veneda Kirksey, MD   General Medicine Division  [facility]  [id number]  04/05/2018 11:49 AM

## 2018-04-09 ENCOUNTER — Ambulatory Visit: Payer: BLUE CROSS/BLUE SHIELD | Admitting: Internal Medicine

## 2018-05-27 ENCOUNTER — Other Ambulatory Visit: Payer: Self-pay | Admitting: Internal Medicine

## 2018-05-27 DIAGNOSIS — E119 Type 2 diabetes mellitus without complications: Secondary | ICD-10-CM

## 2018-07-01 ENCOUNTER — Other Ambulatory Visit: Payer: Self-pay | Admitting: Internal Medicine

## 2018-07-01 DIAGNOSIS — E119 Type 2 diabetes mellitus without complications: Secondary | ICD-10-CM

## 2018-07-09 ENCOUNTER — Other Ambulatory Visit: Payer: Self-pay | Admitting: Internal Medicine

## 2018-07-12 ENCOUNTER — Encounter: Payer: Self-pay | Admitting: Internal Medicine

## 2018-07-12 ENCOUNTER — Ambulatory Visit: Payer: BLUE CROSS/BLUE SHIELD | Attending: Internal Medicine | Admitting: Internal Medicine

## 2018-07-12 VITALS — BP 124/78 | HR 76 | Temp 95.7°F | Ht 69.0 in | Wt 215.0 lb

## 2018-07-12 DIAGNOSIS — E785 Hyperlipidemia, unspecified: Secondary | ICD-10-CM | POA: Insufficient documentation

## 2018-07-12 DIAGNOSIS — F419 Anxiety disorder, unspecified: Secondary | ICD-10-CM | POA: Insufficient documentation

## 2018-07-12 DIAGNOSIS — E119 Type 2 diabetes mellitus without complications: Secondary | ICD-10-CM | POA: Insufficient documentation

## 2018-07-12 LAB — POCT HEMOGLOBIN A1C: Hemoglobin A1C,POC: 7.3 % — ABNORMAL HIGH

## 2018-07-12 NOTE — Progress Notes (Signed)
[facility] Follow-up Note     ReasonReason For Visit:   Follow-up and Diabetes     HPI:      Dale Schultz is 42 y.o. year old male coming in to discuss the following issues:    Type 2 DM- last A1c was 7.0, he continues on Victoza 1.8 mg, metformin 750 mg, glipizide 10 mg daily. He has not been exercising regularly since the start of school year. His diet has been well balanced. His A1c is up to 7.3 today, but he is relieved it was not higher.     Anxiety- continues on Celexa 20 mg and feels that his anxiety has been well controlled on this dose.     HLD- continues on Lipitor 20 mg daily. Tolerating well.     Past Medical History:   Diagnosis Date    Anxiety     Diabetes mellitus     HLD (hyperlipidemia)          Review of Systems:     12 point ROS negative except for HPI above.     Medications/Allergies/Immunizations:     Medication list reviewed and reconciled this visit in the EMR. Allergy list confirmed in the EMR.    Current Outpatient Medications   Medication Sig    citalopram (CELEXA) 20 MG tablet TAKE 1 TABLET BY MOUTH EVERY DAY    VICTOZA 18 MG/3ML injection INJECT 1.8MG  SUBCUTANEOUSLY (UNDER THE SKIN) EVERY DAY    metFORMIN (GLUCOPHAGE-XR) 750 MG 24 hr tablet TAKE 2 TABLETS BY MOUTH EVERY DAY WITH BREAKFAST. SWALLOW WHOLE, DO NOT BREAK, CRUSH OR CHEW    atorvastatin (LIPITOR) 20 MG tablet TAKE 1 TABLET BY MOUTH EVERY DAY    glipiZIDE (GLUCOTROL) 10 MG 24 hr tablet Take 1 tablet (10 mg total) by mouth daily (with breakfast)   SWALLOW WHOLE, DO NOT BREAK,CRUSH OR CHEW    FLUCELVAX QUADRIVALENT 0.5 ML syringe TO BE GIVEN BY PHARMACIST PER STANDING ORDER    insulin pen needle (BD ULTRA-FINE PEN NEEDLE MINI) 31G X 5 MM Use as directed.    alcohol swabs pads Use as directed    BAYER CONTOUR test strip tes 2-3 times daily as needed for 250.02    LANCETS ULTRA THIN MISC Test 3 times daily for 250.02         Past Medical History/Family History/Social History:     Past Medical  History, Family History, and Social History reviewed, confirmed, and updated as appropriate this visit in the EMR.     Physical Exam:     Vitals:    07/12/18 1133   BP: 124/78   BP Location: Left arm   Patient Position: Sitting   Cuff Size: large adult   Pulse: 76   Temp: 35.4 C (95.7 F)   TempSrc: Temporal   Weight: 97.5 kg (215 lb)   Height: 1.753 m (5\' 9" )       BP Readings from Last 3 Encounters:   07/12/18 124/78   04/05/18 128/80   01/07/18 (!) 139/94     Wt Readings from Last 3 Encounters:   07/12/18 97.5 kg (215 lb)   04/05/18 96.3 kg (212 lb 6.4 oz)   01/07/18 99.8 kg (220 lb)       Vitals Signs: UJW:JXBJ mass index is 31.75 kg/m., otherwise as above, reviewed with patient    General: In no apparent distress. Pleasant  HEENT: Eyes- PERRL. Throat- No exudates.  Neck:  No LAD.  Pulmonary: CTA B/L.  No wheezes, rhonchi, or rales.  Cardiovascular: RRR. No murmurs, rubs, or gallops. No pedal edema.  Abdominal: Soft. Non-distended. Non-tender. Bowel sounds present.  Skin: No wounds on feet.  Neuro: AAOx3, no focal deficits. Monofilament testing normal, proprioception normal.   Psych: Affect appropriate. Speech is not pressured.     Labs and Imaging:   Labs and Imaging results reviewed with patient. A1c 7.3 up from 7.0        Assessment and Plan:     [name] was seen today for follow-up and diabetes.    Diagnoses and all orders for this visit:    Type 2 diabetes mellitus without complication, without long-term current use of insulin  -     POCT Hemoglobin A1C  - will continue Victoza, glipizide, and metformin   - will increase his exercise  - no change in medications, but will consider increase in the glipizide at follow up if A1c still above goal.     Hyperlipidemia, unspecified hyperlipidemia type  - continue on statin.     Anxiety  - mood stable on Celexa 20 mg    Other orders  -     POCT HEMOGLOBIN A1C    Handouts Given: Patient educational materials distributed by print-out and/or inserted into AVS    Follow-up:   RTC:  3 months for repeat A1c    Author:   Raiford NobleStewart Daegon Deiss, MD   General Medicine Division  [facility] 4112  07/12/2018 11:38 AM

## 2018-08-04 IMAGING — CT Thorax^1_PE (Adult)
1 series · 14 of 16 positions shown, 18 images · IV contrast (APPLIED)
Comparison: none

[Series 4: pe 3.0 soft tissue · axial · 0.65mm/px · z∈[-260,-50]mm · 14 of 81 slices shown, 18 images]
[im 6/81  soft-tissue]
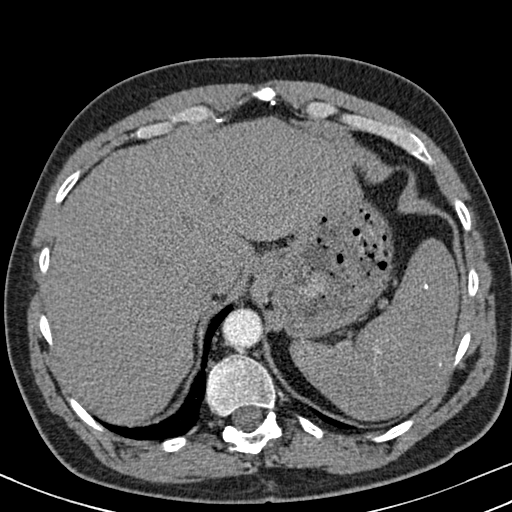
[im 6/81  lung]
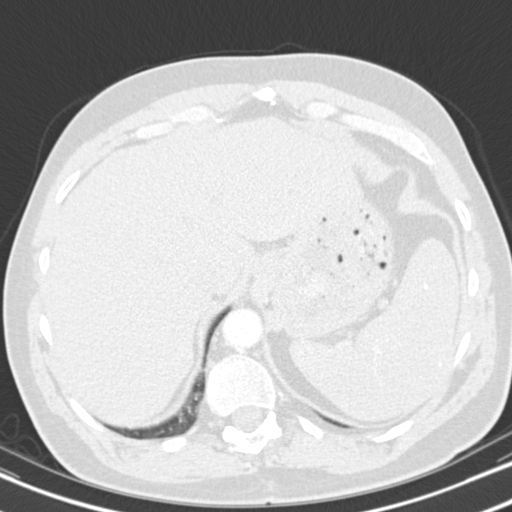
[im 11/81  lung]
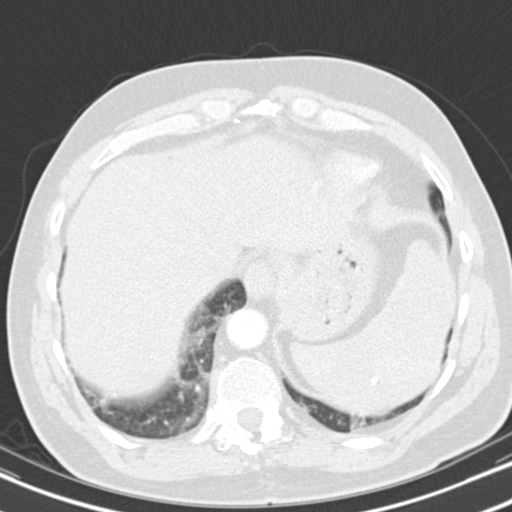
[im 17/81  lung]
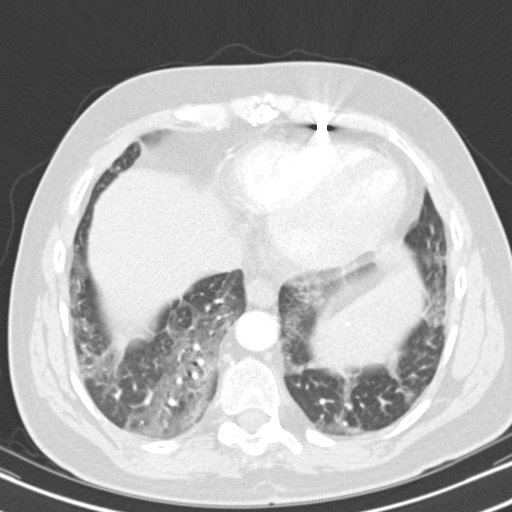
[im 22/81  lung]
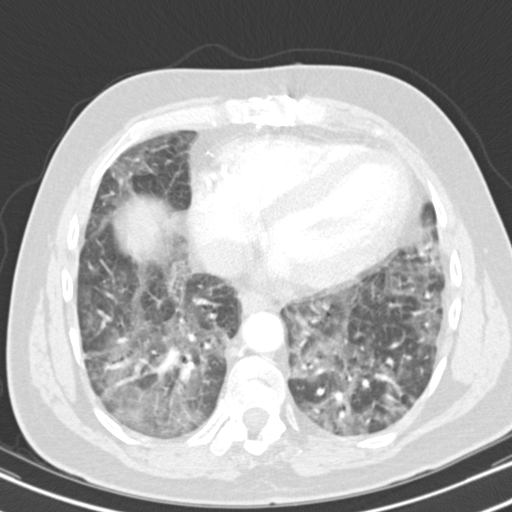
[im 27/81  soft-tissue]
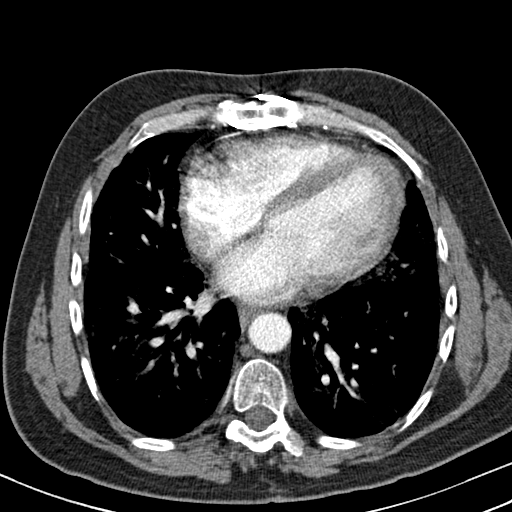
[im 27/81  lung]
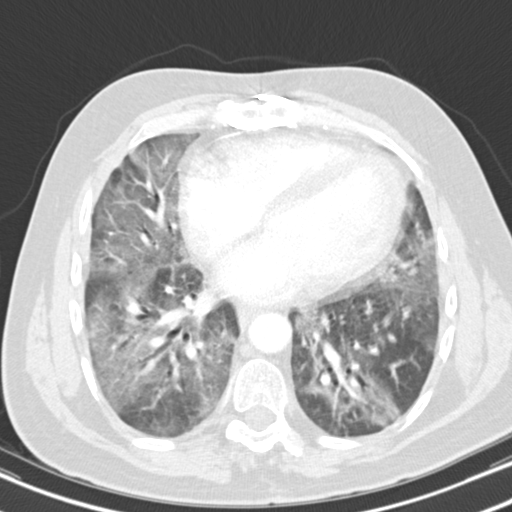
[im 33/81  lung]
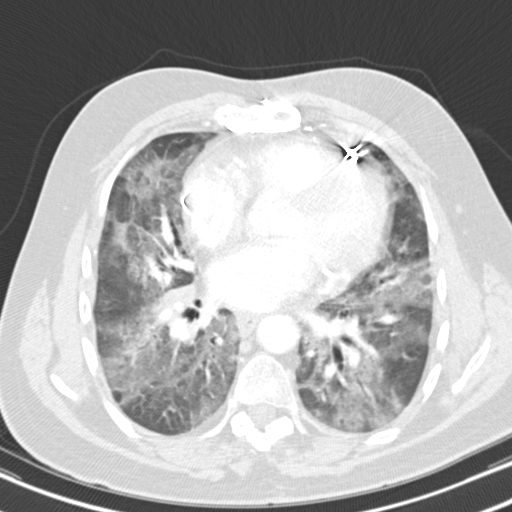
[im 38/81  lung]
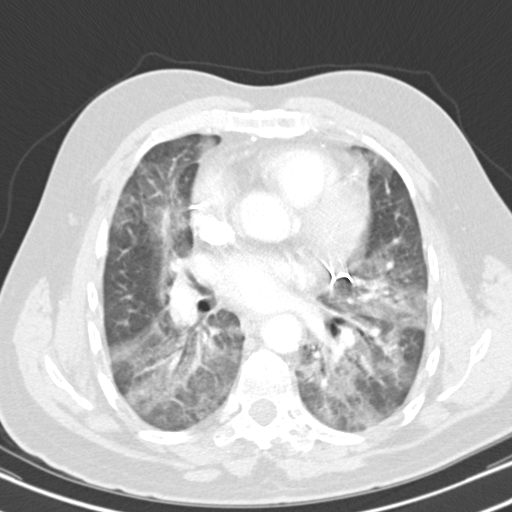
[im 43/81  lung]
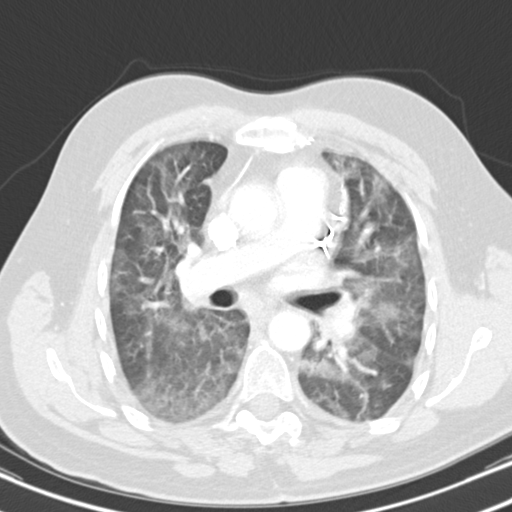
[im 49/81  soft-tissue]
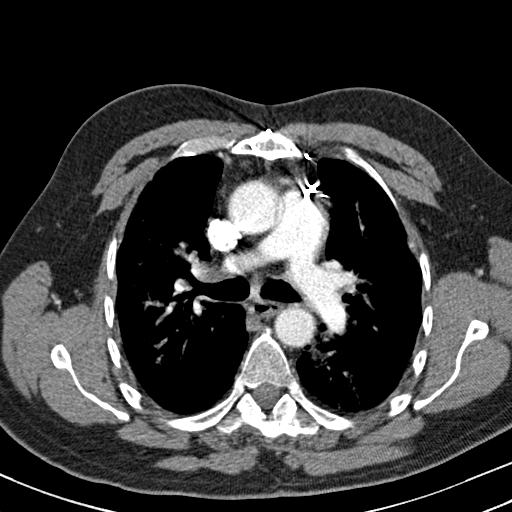
[im 49/81  lung]
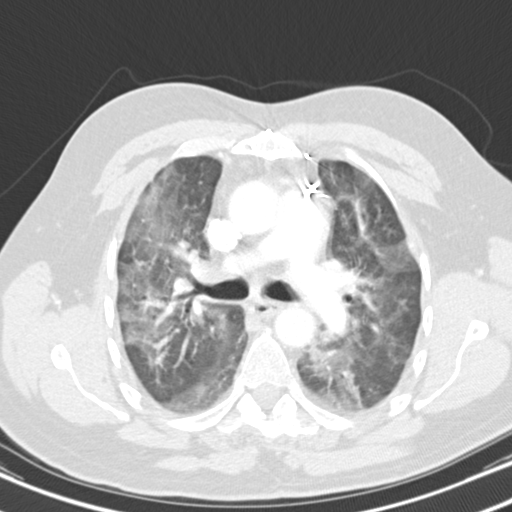
[im 54/81  lung]
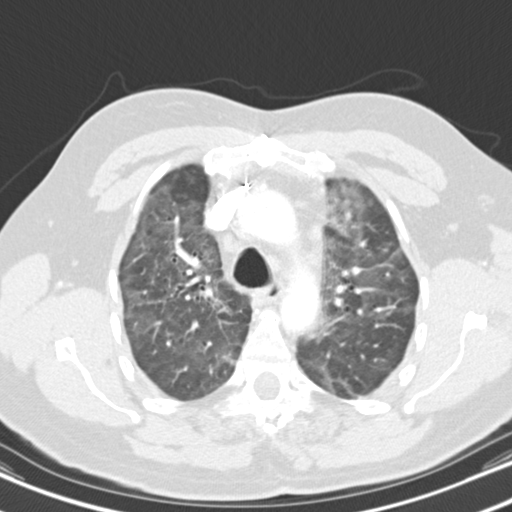
[im 59/81  lung]
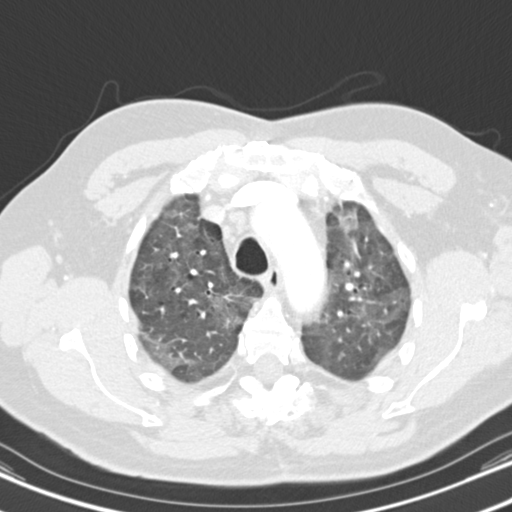
[im 65/81  lung]
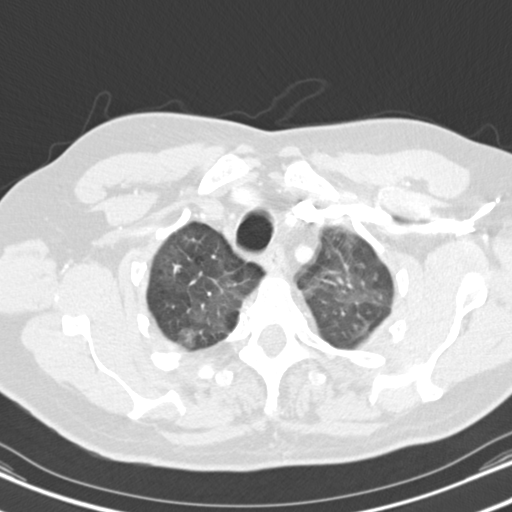
[im 70/81  soft-tissue]
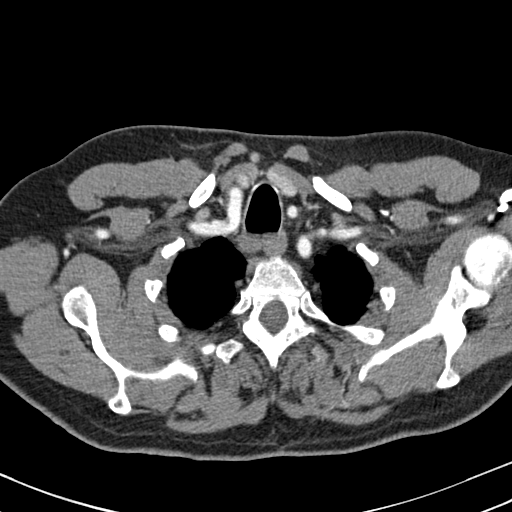
[im 70/81  lung]
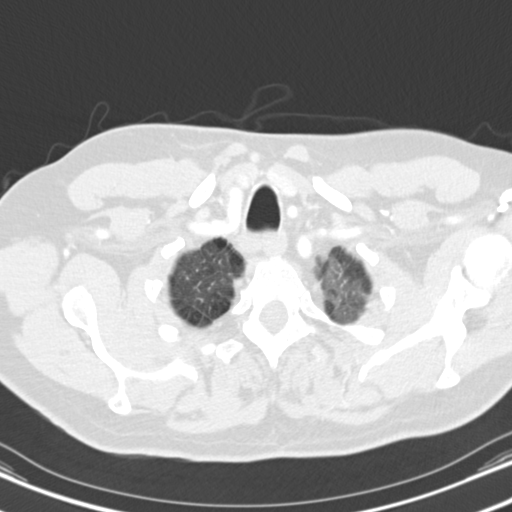
[im 75/81  lung]
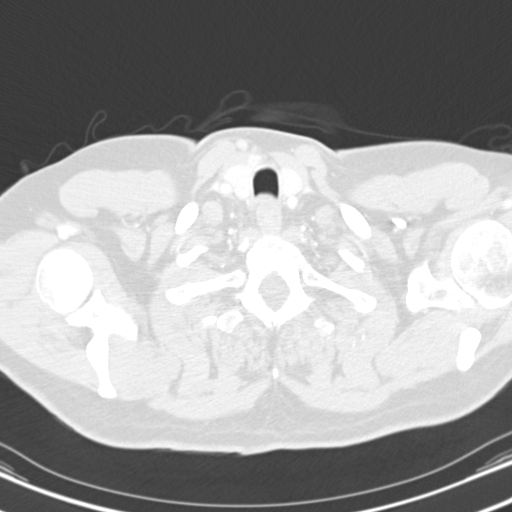

[14 of 16 positions shown; findings below may reference images not displayed]

DIAGNOSTIC STUDIES

EXAM

CT CHEST WITH CONTRAST

INDICATION

40-year-old male with shortness of air, wheezing, and leukocytosis.

TECHNIQUE

Contiguous transaxial imaging through the chest were obtained during the uneventful administration
of 100 milliliter Omnipaque 300 low osmolar intravenous iodinated contrast material. Coronal and
sagittal reformations were obtained from the transaxial source data.

All CT scans at this facility use dose modulation, iterative reconstruction, and/or weight based
dosing when appropriate to reduce radiation dose to as low as reasonably achievable.

COMPARISONS

Same-day chest radiographs.

FINDINGS

The visualized thyroid gland is unremarkable. There is no thoracic lymphadenopathy. The heart size
is mildly enlarged without pericardial effusion. There has been median sternotomy and coronary
artery bypass grafting. The thoracic aorta and main pulmonary artery are normal in caliber.

There are diffuse bilateral 5 lobe ground-glass opacities most concerning for pneumonia given the
patient's reported leukocytosis. Pulmonary edema and ARDS can appear similar. No pulmonary mass or
pleural effusion.

No destructive bony abnormality is identified.

Limited upper abdominal imaging is without evidence of abnormality.

IMPRESSION

1. Diffuse 5 lobe pulmonary ground-glass opacities are most concerning for pneumonia in a patient
with reported leukocytosis. Pulmonary edema and ARDS can appear similar.

## 2018-08-04 IMAGING — CR CHEST
1 series · 1 of 1 positions shown · non-contrast
Comparison: none

PROCEDURE: CHEST
HISTORY: SOA, DYSPNEA, DECREASED O2 SATS.

[chest port x-wise]
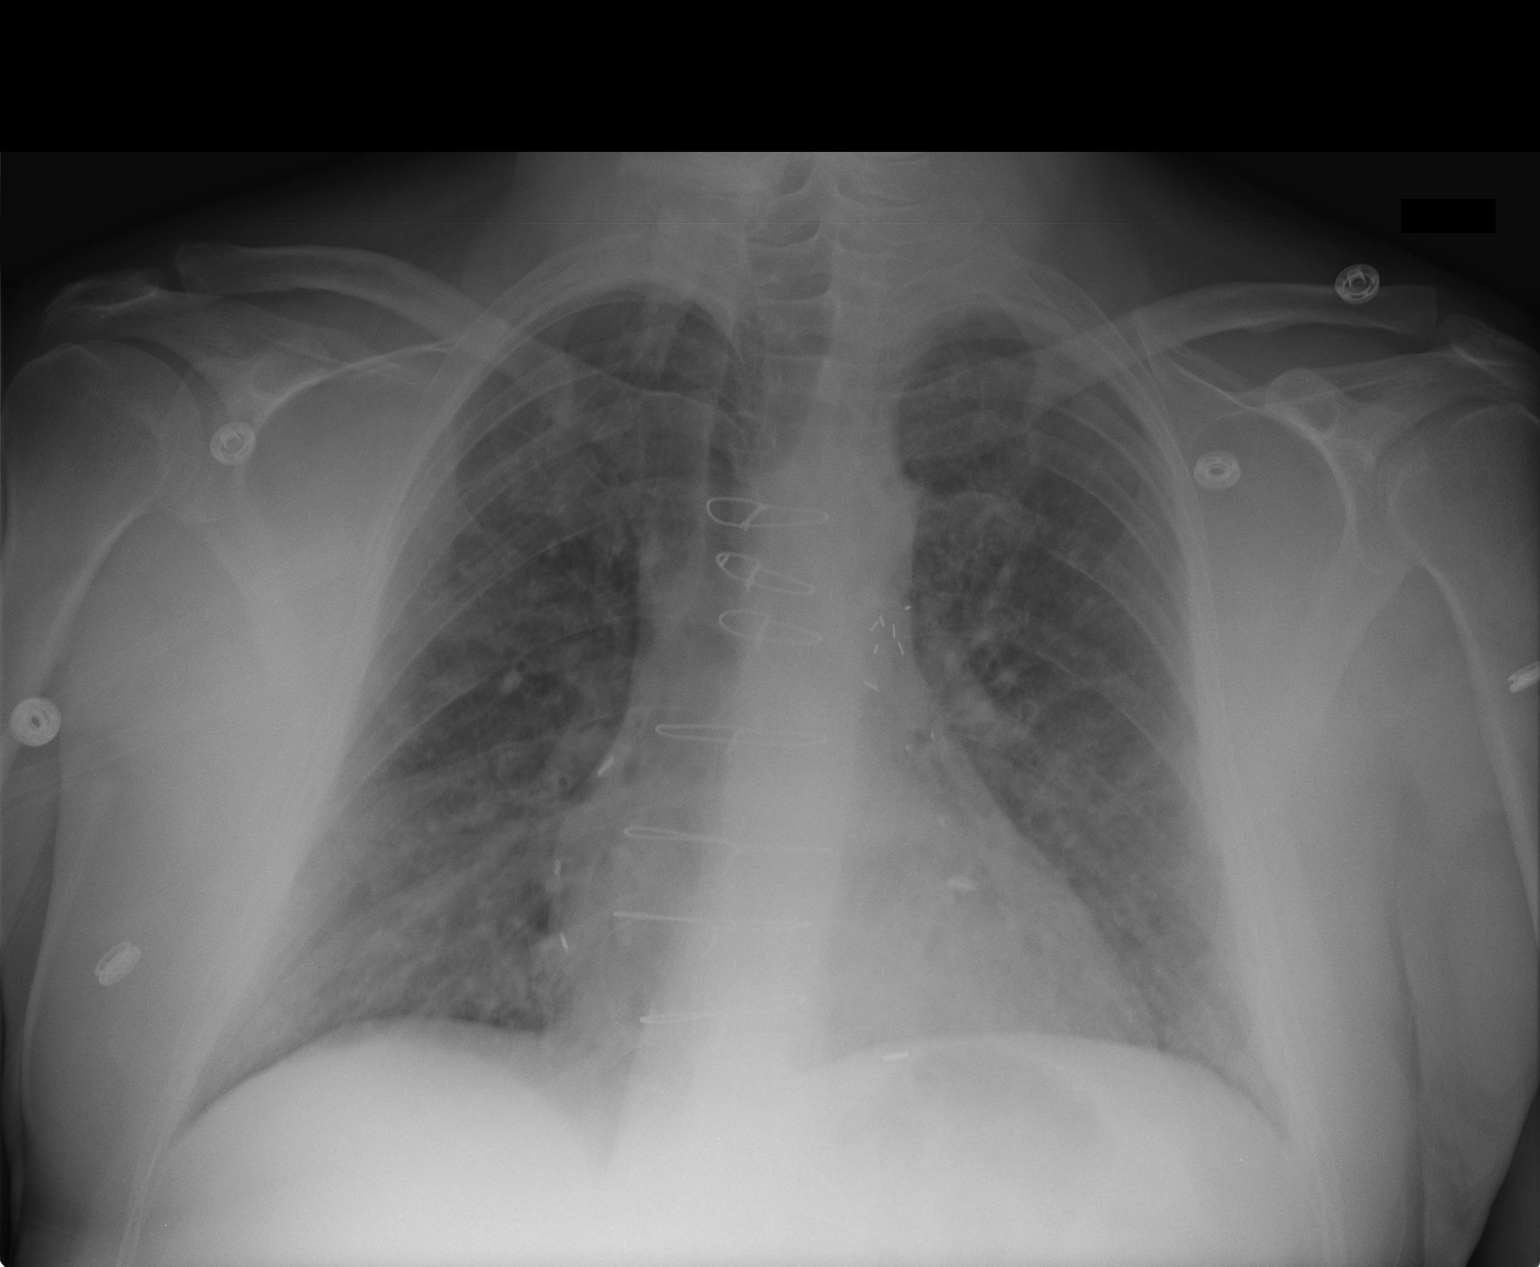

[1 of 1 positions shown; findings below may reference images not displayed]

FINDINGS: Single view chest without comparison shows the cardiac silhouette is normal in size. There
is no pulmonary vascular congestion seen. There is bilateral peribronchial thickening seen with
superimposed patchy airspace opacity seen in the right lower lobe. No pneumothorax is seen. No
pleural effusion is seen. The visualized osseous structures show no acute findings. Surgical changes
are seen of the mediastinum with sternotomy wires.
IMPRESSION: 1. Streaky opacities consistent with bronchitis with superimposed right lower lobe infiltrate is
seen,
follow up recommended.
2. No pleural effusion.

## 2018-09-27 ENCOUNTER — Other Ambulatory Visit: Payer: BLUE CROSS/BLUE SHIELD | Admitting: Internal Medicine

## 2018-09-27 DIAGNOSIS — E119 Type 2 diabetes mellitus without complications: Secondary | ICD-10-CM

## 2018-09-27 DIAGNOSIS — Z794 Long term (current) use of insulin: Secondary | ICD-10-CM

## 2018-09-27 NOTE — Telephone Encounter (Signed)
Medication request routed to Dr Al Corpus for processing 09/27/2018 11:42 AM

## 2018-10-11 ENCOUNTER — Encounter: Payer: Self-pay | Admitting: Internal Medicine

## 2018-10-11 ENCOUNTER — Ambulatory Visit: Payer: BLUE CROSS/BLUE SHIELD | Attending: Internal Medicine | Admitting: Internal Medicine

## 2018-10-11 VITALS — BP 132/82 | HR 80 | Temp 96.3°F | Ht 69.0 in | Wt 221.0 lb

## 2018-10-11 DIAGNOSIS — K589 Irritable bowel syndrome without diarrhea: Secondary | ICD-10-CM | POA: Insufficient documentation

## 2018-10-11 DIAGNOSIS — E119 Type 2 diabetes mellitus without complications: Secondary | ICD-10-CM

## 2018-10-11 DIAGNOSIS — E785 Hyperlipidemia, unspecified: Secondary | ICD-10-CM | POA: Insufficient documentation

## 2018-10-11 DIAGNOSIS — J45991 Cough variant asthma: Secondary | ICD-10-CM | POA: Insufficient documentation

## 2018-10-11 LAB — LIPID PANEL
Chol/HDL Ratio: 3.6
Cholesterol: 128 mg/dL
HDL: 36 mg/dL — ABNORMAL LOW (ref 40–60)
LDL Calculated: 44 mg/dL
Non HDL Cholesterol: 92 mg/dL
Triglycerides: 238 mg/dL — AB

## 2018-10-11 LAB — COMPREHENSIVE METABOLIC PANEL
ALT: 75 U/L — ABNORMAL HIGH (ref 0–50)
AST: 56 U/L — ABNORMAL HIGH (ref 0–50)
Albumin: 4.9 g/dL (ref 3.5–5.2)
Alk Phos: 38 U/L — ABNORMAL LOW (ref 40–130)
Anion Gap: 14 (ref 7–16)
Bilirubin,Total: 0.4 mg/dL (ref 0.0–1.2)
CO2: 26 mmol/L (ref 20–28)
Calcium: 9.3 mg/dL (ref 9.0–10.3)
Chloride: 96 mmol/L (ref 96–108)
Creatinine: 0.86 mg/dL (ref 0.67–1.17)
GFR,Black: 121 *
GFR,Caucasian: 105 *
Glucose: 311 mg/dL — ABNORMAL HIGH (ref 60–99)
Lab: 15 mg/dL (ref 6–20)
Potassium: 4.6 mmol/L (ref 3.3–5.1)
Sodium: 136 mmol/L (ref 133–145)
Total Protein: 7.3 g/dL (ref 6.3–7.7)

## 2018-10-11 LAB — MICROALBUMIN, URINE, RANDOM
Creatinine,UR: 100 mg/dL (ref 20–300)
Microalbumin,UR: <1.2 mg/dL

## 2018-10-11 LAB — POCT HEMOGLOBIN A1C: Hemoglobin A1C,POC: 8.7 % — ABNORMAL HIGH

## 2018-10-11 MED ORDER — ALBUTEROL SULFATE HFA 108 (90 BASE) MCG/ACT IN AERS *I*
1.0000 | INHALATION_SPRAY | Freq: Four times a day (QID) | RESPIRATORY_TRACT | 3 refills | Status: AC | PRN
Start: 2018-10-11 — End: 2019-04-09

## 2018-10-11 MED ORDER — CANAGLIFLOZIN 100 MG PO TABS *A*
100.0000 mg | ORAL_TABLET | Freq: Every day | ORAL | 1 refills | Status: DC
Start: 2018-10-11 — End: 2019-01-13

## 2018-10-11 NOTE — Progress Notes (Signed)
[facility] Follow-up Note     ReasonReason For Visit:   Follow up    HPI:      Dale Schultz is 43 y.o. year old male coming in to discuss the following issues:    Type 2 DM- last A1c was 7.3 up from 7.0 previously. He continues on Victoza 1.8 mg, glipizide 10 mg, and metformin 1500 mg daily. He is concerned that his BG was going to be high today. He has not been following strict diet and has not been exercising regularly. His weight is up as well.     He feels like he has been coughing a lot recently, to the point where he will actually throw up at times. This will happen when he is exercising, out shoveling the driveway, typically when he is exerting himself. Once her calms down the cough will persist. No hx of asthma. Denies SOB, DOE, chest tightness. Does not happen at night, and this only happens when he is outside doing things. This has been present for the last year. He will feel some heartburn, acid reflux taste while he is coughing, as well as some GERD like sxs.     HTN- not currently on BP medications.    HLD- continues on Lipitor 20 mg. Tolerating this well.     Anxiety- continues on Celexa. Feels his anxiety sxs have been well controlled     Past Medical History:   Diagnosis Date    Anxiety     Diabetes mellitus     HLD (hyperlipidemia)          Review of Systems:     12 point ROS negative except for HPI above.     Medications/Allergies/Immunizations:     Medication list reviewed and reconciled this visit in the EMR. Allergy list confirmed in the EMR.    Current Outpatient Medications   Medication Sig    citalopram (CELEXA) 20 MG tablet TAKE 1 TABLET BY MOUTH EVERY DAY    VICTOZA 18 MG/3ML injection INJECT 1.8MG  SUBCUTANEOUSLY (UNDER THE SKIN) EVERY DAY    metFORMIN (GLUCOPHAGE-XR) 750 MG 24 hr tablet TAKE 2 TABLETS BY MOUTH EVERY DAY WITH BREAKFAST. SWALLOW WHOLE, DO NOT BREAK, CRUSH OR CHEW    atorvastatin (LIPITOR) 20 MG tablet TAKE 1 TABLET BY MOUTH EVERY DAY     albuterol HFA (PROVENTIL, VENTOLIN, PROAIR HFA) 108 (90 Base) MCG/ACT inhaler Inhale 1-2 puffs into the lungs every 6 hours as needed for Wheezing (cough) Shake well before each use.    canagliflozin (INVOKANA) 100 MG tablet Take 1 tablet (100 mg total) by mouth daily (before breakfast)    insulin pen needle (B-D UF III MINI PEN NEEDLES) 31G X 5 MM USE AS DIRECTED    FLUCELVAX QUADRIVALENT 0.5 ML syringe TO BE GIVEN BY PHARMACIST PER STANDING ORDER    alcohol swabs pads Use as directed    BAYER CONTOUR test strip tes 2-3 times daily as needed for 250.02    LANCETS ULTRA THIN MISC Test 3 times daily for 250.02         Past Medical History/Family History/Social History:     Past Medical History, Family History, and Social History reviewed, confirmed, and updated as appropriate this visit in the EMR.     Physical Exam:     Vitals:    10/11/18 1113   BP: 132/82   BP Location: Left arm   Patient Position: Sitting   Cuff Size: adult   Pulse: 80  Temp: 35.7 C (96.3 F)   TempSrc: Temporal   Weight: 100.2 kg (221 lb)   Height: 1.753 m (5\' 9" )       BP Readings from Last 3 Encounters:   10/11/18 132/82   07/12/18 124/78   04/05/18 128/80     Wt Readings from Last 3 Encounters:   10/11/18 100.2 kg (221 lb)   07/12/18 97.5 kg (215 lb)   04/05/18 96.3 kg (212 lb 6.4 oz)       Vitals Signs: MEB:RAXE mass index is 32.64 kg/m., otherwise as above, reviewed with patient    General: In no apparent distress. Pleasant  HEENT: Eyes- PERRL. Throat- No exudates.  Neck:  No LAD.  Pulmonary: CTA B/L. No wheezes, rhonchi, or rales.  Cardiovascular: No JVD, No carotid bruits. RRR. No murmurs, rubs, or gallops. No pedal edema.  Abdominal: Soft. Non-distended. Non-tender. Bowel sounds present.  Neuro: AAOx3, no focal deficits  Psych: Affect appropriate. Speech is not pressured.     Labs and Imaging:   Labs and Imaging results reviewed with patient. A1c at 8.7.        Assessment and Plan:     Diagnoses and all orders for this  visit:    Type 2 diabetes mellitus without complication, without long-term current use of insulin  -     POCT Hemoglobin A1C  -     Comprehensive metabolic panel; Future  -     Lipid Panel (Reflex to Direct  LDL if Triglycerides more than 400); Future  -     Microalbumin, Urine, Random; Future  -   Start canagliflozin (INVOKANA) 100 MG tablet; Take 1 tablet (100 mg total) by mouth daily (before breakfast)  - will discontinue the glipizide today, and start SGLT2 as above.   - continue Victoza, metformin at current dose.     Hyperlipidemia, unspecified hyperlipidemia type  - continue Lipitor 20 mg     Irritable bowel syndrome, unspecified type  - sxs stable    Cough variant asthma- suspect that his cough is related to reactive airway as opposed to GERD given the onset of sxs with exertion while he is outside only.  -     albuterol HFA (PROVENTIL, VENTOLIN, PROAIR HFA) 108 (90 Base) MCG/ACT inhaler; Inhale 1-2 puffs into the lungs every 6 hours as needed for Wheezing (cough) Shake well before each use.  - will trial pepcid as well given he has some GERD sxs.       Handouts Given: Patient educational materials distributed by print-out and/or inserted into AVS   Follow-up:   RTC:  3 months for repeat A1c.   Author:   Raiford Noble, MD   General Medicine Division  [facility] [phone]  10/11/2018 8:30 AM

## 2018-10-12 ENCOUNTER — Encounter: Payer: Self-pay | Admitting: Internal Medicine

## 2018-11-25 ENCOUNTER — Other Ambulatory Visit: Payer: Self-pay | Admitting: Internal Medicine

## 2018-11-25 DIAGNOSIS — E119 Type 2 diabetes mellitus without complications: Secondary | ICD-10-CM

## 2018-11-25 NOTE — Telephone Encounter (Signed)
Medication request routed to Dr Al Corpus for processing 11/25/2018 8:05 AM

## 2018-12-11 ENCOUNTER — Other Ambulatory Visit: Payer: Self-pay | Admitting: Internal Medicine

## 2018-12-11 DIAGNOSIS — E119 Type 2 diabetes mellitus without complications: Secondary | ICD-10-CM

## 2018-12-13 NOTE — Telephone Encounter (Signed)
Medication request routed to Dr Mahler for processing 12/13/2018 1:59 PM

## 2019-01-03 ENCOUNTER — Other Ambulatory Visit: Payer: Self-pay | Admitting: Internal Medicine

## 2019-01-04 ENCOUNTER — Telehealth: Payer: Self-pay | Admitting: Internal Medicine

## 2019-01-04 NOTE — Telephone Encounter (Signed)
Writer attempted to reach pt for appt on 5/19 with pcp to convert to ZOOM    Voicemail was left, if pt returns call please warm transfer

## 2019-01-10 NOTE — Telephone Encounter (Unsigned)
Copied from CRM [id number]. Topic: Return Call - Speak to Provider/Office Staff  >> Jan 10, 2019  4:08 PM Waldo Laine wrote:  Patient reaching back out to [place] convert his 5/19, 1:40 pm to a zoom chat with Dr. Al Corpus.    Writer Im'ed [place], not available to take the call.    Patient is requesting the office convert the appointment send the invite to his Mychart    Patient can be reached [phone]

## 2019-01-11 ENCOUNTER — Ambulatory Visit: Payer: BLUE CROSS/BLUE SHIELD | Admitting: Internal Medicine

## 2019-01-11 ENCOUNTER — Telehealth: Payer: Self-pay | Admitting: Internal Medicine

## 2019-01-11 DIAGNOSIS — K589 Irritable bowel syndrome without diarrhea: Secondary | ICD-10-CM

## 2019-01-11 DIAGNOSIS — R740 Nonspecific elevation of levels of transaminase and lactic acid dehydrogenase [LDH]: Secondary | ICD-10-CM

## 2019-01-11 DIAGNOSIS — F419 Anxiety disorder, unspecified: Secondary | ICD-10-CM

## 2019-01-11 DIAGNOSIS — E785 Hyperlipidemia, unspecified: Secondary | ICD-10-CM

## 2019-01-11 DIAGNOSIS — H5462 Unqualified visual loss, left eye, normal vision right eye: Secondary | ICD-10-CM

## 2019-01-11 DIAGNOSIS — E119 Type 2 diabetes mellitus without complications: Secondary | ICD-10-CM

## 2019-01-11 DIAGNOSIS — R7401 Elevation of levels of liver transaminase levels: Secondary | ICD-10-CM | POA: Insufficient documentation

## 2019-01-11 NOTE — Progress Notes (Signed)
Video Visit     Location of Patient: home    Location of Telemedicine Provider: home office    Other participants in telemedicine encounter and roles:  none    This is an established patient visit.    Reason for visit: Follow up    HPI  Dale Schultz is a 43 y.o. man with hx of Type 2 DM, HLD, anxiety presenting for follow up of his DM.     He has been exercising on a daily basis.     He has been having a lot of issues with his left eye. His vision is very blurry on the left. This has progressed over the last few months. Denies diplopia, redness or pain. He saw ophtho recently. He had a lens issue in his right eye that required surgery about 8 years ago, he feels like this is similar. No other focal neurologic deficits.     His last A1c was elevated at 8.7 up from 7.3. he was started on Invokana, and his glipizide was discontinued. He continues on Victoza, and metformin. He has been urinating more, but drinking more water. Tolerating the Invokana well.     Elevated liver enzymes- last CMP showed slight elevations in ALT/AST.     Anxiety- continues on Celexa 20 mg. Mood has been stable. He is going back to work tomorrow for the first time. He is anxious about his vision loss and really wants to have this addressed ASAP.     HLD- tolerating Lipitor 20 mg.       ROS  Denies HA, lightheadedness, dizziness, CP, SOB, cough, fever, chills, n/v, abdominal pain, diarrhea, constipation, dysuria, hematuria.        Patient's problem list, allergies, and medications were reviewed and updated as appropriate.  Please see the EHR for full details.    Exam and data reviewed:  NAD, no conjunctival injection, EOM intact, no periorbital edema noted. Pupils appear equal.     Assessment & Plan:  Rapid vision loss- non-painful, placed urgent referral to [facility] to evaluate  - with his hx of DM, concern for retinopathy, glaucoma, cataracts.     Type 2 DM- tolerating Invokana  - update A1c at the lab  - continue metformin and invokana.      Elevated transaminases- update hepatic function panel   - will obtain RUQ [place] if this elevation remains.    HLD- continue statin.       Consent was obtained from the patient to complete this video visit; including the potential for financial liability.          Raiford Noble, MD

## 2019-01-11 NOTE — Telephone Encounter (Signed)
Call to [name] to give appointment scheduled for Optho as requested at last office visit.     Elmin Reininger is scheduled on Wednesday, May 20th, 2020 at 2pm.    Prep for this exam :none     This test is located at [facility] [place].  Patient to wait in vehicle once he arrives/parks and call [facility] at [phone] for further instructions.    Should patient need to reschedule, they can call [phone].    Call Outcome: Attempted to contact patient. Left a detailed voicemail regarding the patients scheduled appt.   and instructions as to what to do when he arrives/parks.  Patient is to call [facility] when he is parked in his car at [phone].

## 2019-01-12 ENCOUNTER — Other Ambulatory Visit
Admission: RE | Admit: 2019-01-12 | Discharge: 2019-01-12 | Disposition: A | Discharge status: Home / Self Care | Payer: BLUE CROSS/BLUE SHIELD | Source: Ambulatory Visit | Attending: Internal Medicine | Admitting: Internal Medicine

## 2019-01-12 ENCOUNTER — Encounter: Payer: Self-pay | Admitting: Ophthalmology

## 2019-01-12 ENCOUNTER — Ambulatory Visit: Payer: BLUE CROSS/BLUE SHIELD | Admitting: Ophthalmology

## 2019-01-12 DIAGNOSIS — E119 Type 2 diabetes mellitus without complications: Secondary | ICD-10-CM

## 2019-01-12 DIAGNOSIS — R740 Nonspecific elevation of levels of transaminase and lactic acid dehydrogenase [LDH]: Secondary | ICD-10-CM | POA: Insufficient documentation

## 2019-01-12 DIAGNOSIS — R7401 Elevation of levels of liver transaminase levels: Secondary | ICD-10-CM

## 2019-01-12 DIAGNOSIS — H26492 Other secondary cataract, left eye: Secondary | ICD-10-CM

## 2019-01-12 LAB — HEPATIC FUNCTION PANEL
ALT: 52 U/L — ABNORMAL HIGH (ref 0–50)
AST: 36 U/L (ref 0–50)
Albumin: 4.9 g/dL (ref 3.5–5.2)
Alk Phos: 43 U/L (ref 40–130)
Bilirubin,Direct: <0.2 mg/dL (ref 0.0–0.3)
Bilirubin,Total: 0.5 mg/dL (ref 0.0–1.2)
Total Protein: 7.8 g/dL — ABNORMAL HIGH (ref 6.3–7.7)

## 2019-01-12 NOTE — Progress Notes (Signed)
Outpatient Visit      Patient name: Dale Schultz  DOB: Feb 14, 1976       Age: 43 y.o.  MR#: [id number]    Encounter Date: 01/12/2019    Subjective:     Chief Complaint:   Chief Complaint   Patient presents with    New Patient Visit     HPI     Pt presented for blurry vision OS that is more noted in the past several   weeks.     Last edited by Demitri Kucinski, Roda Shutters, MD on 01/12/2019  3:18 PM. (History)          Current Outpatient Medications:     citalopram (CELEXA) 20 MG tablet, TAKE 1 TABLET BY MOUTH EVERY DAY, Disp: 90 tablet, Rfl: 3    VICTOZA 18 MG/3ML injection, INJECT 1.8MG  SUBCUTANEOUSLY (UNDER THE SKIN) EVERY DAY, Disp: 9 mL, Rfl: 5    glipiZIDE (GLUCOTROL) 10 MG 24 hr tablet, TAKE 1 TABLET BY MOUTH EVERY DAY WITH BREAKFAST SWALLOW WHOLE, DO NOT BREAK, CRUSH OR CHEW, Disp: 90 tablet, Rfl: 3    albuterol HFA (PROVENTIL, VENTOLIN, PROAIR HFA) 108 (90 Base) MCG/ACT inhaler, Inhale 1-2 puffs into the lungs every 6 hours as needed for Wheezing (cough) Shake well before each use., Disp: 1 each, Rfl: 3    canagliflozin (INVOKANA) 100 MG tablet, Take 1 tablet (100 mg total) by mouth daily (before breakfast), Disp: 90 tablet, Rfl: 1    insulin pen needle (B-D UF III MINI PEN NEEDLES) 31G X 5 MM, USE AS DIRECTED, Disp: 100 each, Rfl: 5    FLUCELVAX QUADRIVALENT 0.5 ML syringe, TO BE GIVEN BY PHARMACIST PER STANDING ORDER, Disp: , Rfl: 0    metFORMIN (GLUCOPHAGE-XR) 750 MG 24 hr tablet, TAKE 2 TABLETS BY MOUTH EVERY DAY WITH BREAKFAST. SWALLOW WHOLE, DO NOT BREAK, CRUSH OR CHEW, Disp: 180 tablet, Rfl: 3    atorvastatin (LIPITOR) 20 MG tablet, TAKE 1 TABLET BY MOUTH EVERY DAY, Disp: 90 tablet, Rfl: 5    alcohol swabs pads, Use as directed, Disp: 100 each, Rfl: 5    BAYER CONTOUR test strip, tes 2-3 times daily as needed for 250.02, Disp: 100 each, Rfl: 5    LANCETS ULTRA THIN MISC, Test 3 times daily for 250.02, Disp: 100 each, Rfl: 5  Allergies: Penicillins     Medical History:   Past Medical History:   Diagnosis Date     Anxiety     Diabetes mellitus     HLD (hyperlipidemia)         Surgical History: No past surgical history on file.     ROS     Positive for: Eyes    Last edited by Serai Tukes, Roda Shutters, MD on 01/12/2019  2:55 PM. (History)      Objective:     Base Eye Exam     Visual Acuity (Snellen - Linear)       Right Left    Dist sc 20/40 20/20 -2    Dist ph sc 20/20 -2           Tonometry (Tonopen, 2:55 PM)       Right Left    Pressure 18 18          Pupils       Pupils APD    Right PERRLA None    Left PERRLA None          Visual Fields       Left Right     Full  Full          Extraocular Movement       Right Left     Full, Ortho Full, Ortho            Slit Lamp and Fundus Exam     External Exam       Right Left    External Normal ocular adnexae, lacrimal gland & drainage, orbits Normal ocular adnexae, lacrimal gland & drainage, orbits          Slit Lamp Exam       Right Left    Lids/Lashes Normal structure & position Normal structure & position    Conjunctiva/Sclera Normal bulbar/palpebral, conjunctiva, sclera Normal bulbar/palpebral, conjunctiva, sclera    Cornea Normal epithelium, stroma, endothelium, tear film Normal epithelium, stroma, endothelium, tear film    Anterior Chamber Clear & deep Clear & deep    Iris Normal shape, size, morphology Normal shape, size, morphology    Lens Normal cortex, nucleus, anterior/posterior capsule, clarity PCIOL, 1+ PCO encroaching to the center from superior     Vitreous Clear Clear          Fundus Exam       Right Left    Disc Normal size, appearance, nerve fiber layer Normal size, appearance, nerve fiber layer    C/D Ratio 0.3 0.3    Macula Normal Normal    Vessels Normal Normal    Periphery Normal Normal                      No annotated images are attached to the encounter.      Assessment/Plan:     Assessment      Pseudophakia OS  PCO OS  - [facility]  - PCO close to visual axis, close to VS  - plan to observe for another 6 months   - BAT next visit    Diabetes without  retinopathy  - pending updated A1c  - high BG  - currently no diabetic retinopathy  - encouraged pt to achieve better BG, BP control     [facility]   Last [facility]FE: 01/12/2019   Last MRx: none    Tech next visit and testing: VA, IOP, Pupil, BAT, MRx, Dilate        Patient seen and discussed with attending, Dr. Fayrene HelperBoghani.    Authored by Roda ShuttersXu Aubre Quincy, MD on 01/12/2019 at 3:19 PM

## 2019-01-13 ENCOUNTER — Encounter: Payer: Self-pay | Admitting: Internal Medicine

## 2019-01-13 ENCOUNTER — Telehealth: Payer: Self-pay

## 2019-01-13 DIAGNOSIS — E119 Type 2 diabetes mellitus without complications: Secondary | ICD-10-CM

## 2019-01-13 LAB — HEMOGLOBIN A1C: Hemoglobin A1C: 8.9 % — ABNORMAL HIGH

## 2019-01-13 MED ORDER — CANAGLIFLOZIN 300 MG PO TABS *A*
300.0000 mg | ORAL_TABLET | Freq: Every day | ORAL | 3 refills | Status: DC
Start: 2019-01-13 — End: 2019-03-28

## 2019-01-13 NOTE — Telephone Encounter (Signed)
LVM for pt to schedule an appt in [facility] for a DM exam in November.

## 2019-01-26 ENCOUNTER — Encounter: Payer: Self-pay | Admitting: Internal Medicine

## 2019-01-26 ENCOUNTER — Telehealth: Payer: Self-pay

## 2019-01-26 DIAGNOSIS — E119 Type 2 diabetes mellitus without complications: Secondary | ICD-10-CM

## 2019-01-26 NOTE — Telephone Encounter (Signed)
NA diabetes

## 2019-01-26 NOTE — Telephone Encounter (Signed)
Type 2 diabetes, patient preferred to see an endocrinologist. A1c still elevated above goal on metformin, Invokana and Victoza.    Please review and advise.

## 2019-01-31 NOTE — Telephone Encounter (Signed)
Attempt 1 of 2: No answer, message left.

## 2019-02-02 NOTE — Telephone Encounter (Signed)
error 

## 2019-03-22 ENCOUNTER — Telehealth: Payer: Self-pay | Admitting: Internal Medicine

## 2019-03-22 NOTE — Telephone Encounter (Signed)
Copied from [place] [id number]. Topic: Appointments - Schedule Appointment  >> Mar 22, 2019  2:23 PM Tilden Dome wrote:  Patient would like to schedule zoom chat with Dr. Lianne Bushy.  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 Lianne Bushy before he resumes school.  Mr. Yisroel Ramming is requesting a return call [phone] to set up the zoom chat .

## 2019-03-22 NOTE — Telephone Encounter (Signed)
Spoke with patient and he was not aware he already had an appointment with Dr.Mahler on 8/18. Changed that appointment to zoom.

## 2019-03-26 ENCOUNTER — Other Ambulatory Visit: Payer: Self-pay | Admitting: Internal Medicine

## 2019-03-26 DIAGNOSIS — E119 Type 2 diabetes mellitus without complications: Secondary | ICD-10-CM

## 2019-03-28 MED ORDER — CANAGLIFLOZIN 300 MG PO TABS *A*
300.0000 mg | ORAL_TABLET | Freq: Every day | ORAL | 3 refills | Status: DC
Start: 2019-03-28 — End: 2020-04-16

## 2019-04-07 ENCOUNTER — Other Ambulatory Visit: Payer: Self-pay | Admitting: Internal Medicine

## 2019-04-07 DIAGNOSIS — E119 Type 2 diabetes mellitus without complications: Secondary | ICD-10-CM

## 2019-04-11 ENCOUNTER — Encounter: Payer: Self-pay | Admitting: Internal Medicine

## 2019-04-12 ENCOUNTER — Telehealth: Payer: Self-pay | Admitting: Internal Medicine

## 2019-04-12 ENCOUNTER — Ambulatory Visit: Payer: BLUE CROSS/BLUE SHIELD | Admitting: Internal Medicine

## 2019-04-12 DIAGNOSIS — E119 Type 2 diabetes mellitus without complications: Secondary | ICD-10-CM

## 2019-04-12 DIAGNOSIS — R740 Nonspecific elevation of levels of transaminase and lactic acid dehydrogenase [LDH]: Secondary | ICD-10-CM

## 2019-04-12 DIAGNOSIS — R7401 Elevation of levels of liver transaminase levels: Secondary | ICD-10-CM

## 2019-04-12 DIAGNOSIS — F419 Anxiety disorder, unspecified: Secondary | ICD-10-CM

## 2019-04-12 NOTE — Telephone Encounter (Signed)
Called patient and left a message to call the office back.    If patient calls back in please schedule 3 month fuv with Dr.Mahler in November.

## 2019-04-12 NOTE — Progress Notes (Signed)
Video Visit     Location of Patient: home    Location of Telemedicine Provider: clinical office    Other participants in telemedicine encounter and roles:  none    This is an established patient visit.    Reason for visit: Follow up     HPI  Dale Schultz is a 43 y.o. man with hx of type 2 DM, anxiety, HLD presenting for follow up.     Type 2 DM- he continues on Invokana 300 mg, Victoza, glipizide and metformin. His invokana was increased to 300 mg daily in May. He has been exercising continually, and he has lost weight as well. His shorts no longer fit him, which he is happy about due to the degree of weight loss.     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 He sent job description via [employer] to discuss his work role, and if there is cause for concern going back in person. 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 About 700 students in the building each day. His biggest worry is that with his underlying DM will put him at a higher risk. They are all wearing masks, setting up the sanitizations stations and cleaning throughout the day. His anxiety has been very high recently due to the changes. They do have a new filtration system and they can filter out very small particles as well, so he feels comfortable.     ROS  Denies HA, lightheadedness, dizziness, CP, SOB, cough, fever, chills, n/v, abdominal pain, diarrhea, constipation, dysuria, hematuria.       Patient's problem list, allergies, and medications were reviewed and updated as appropriate.  Please see the EHR for full details.    Exam and data reviewed:  Appears well no acute distress.     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   - counseled on precautionary measures to keep himself and staff safe as school starts. Overall his school seems well equipped for the upcoming [place] school  year.   - provided reassurance and he will call if things change.    Type 2 DM- he continue on the Victoza, glipizide, metformin and Invokana.   - update A1c, BMP  - he is still interested in establishing with Endocrine. Will call to schedule NPV.     RTC in 3 months for follow up.       Chinita Greenland, MD   General Medicine Division  [facility] [phone]  04/12/2019 7:14 PM      Consent was obtained from the patient to complete this video visit; including the potential for financial liability.          Chinita Greenland, MD

## 2019-04-12 NOTE — Telephone Encounter (Signed)
-----   Message from Chinita Greenland, MD sent at 04/12/2019  2:40 PM EDT -----  Please call to set up 3 month follow up

## 2019-04-15 ENCOUNTER — Ambulatory Visit: Payer: BLUE CROSS/BLUE SHIELD | Admitting: Endocrinology

## 2019-04-19 NOTE — Telephone Encounter (Signed)
Looks like patient is scheduled for 11/9.

## 2019-05-03 ENCOUNTER — Other Ambulatory Visit: Payer: Self-pay | Admitting: Internal Medicine

## 2019-05-03 DIAGNOSIS — E119 Type 2 diabetes mellitus without complications: Secondary | ICD-10-CM

## 2019-05-22 ENCOUNTER — Other Ambulatory Visit: Payer: Self-pay | Admitting: Internal Medicine

## 2019-05-22 DIAGNOSIS — E119 Type 2 diabetes mellitus without complications: Secondary | ICD-10-CM

## 2019-05-23 NOTE — Telephone Encounter (Signed)
Medication request routed to Dr Lianne Bushy for processing 05/23/2019 9:34 AM

## 2019-05-28 ENCOUNTER — Encounter: Payer: Self-pay | Admitting: Internal Medicine

## 2019-05-31 ENCOUNTER — Encounter: Payer: Self-pay | Admitting: Internal Medicine

## 2019-06-01 ENCOUNTER — Ambulatory Visit: Payer: BLUE CROSS/BLUE SHIELD | Admitting: Student in an Organized Health Care Education/Training Program

## 2019-06-01 NOTE — Telephone Encounter (Signed)
Video visit invitation sent to patient via MyChart account and appointment information updated    [email or web address]

## 2019-06-03 ENCOUNTER — Encounter: Payer: Self-pay | Admitting: Internal Medicine

## 2019-06-15 ENCOUNTER — Ambulatory Visit: Payer: BLUE CROSS/BLUE SHIELD | Admitting: Optometry

## 2019-06-15 ENCOUNTER — Encounter: Payer: Self-pay | Admitting: Optometry

## 2019-06-15 DIAGNOSIS — E119 Type 2 diabetes mellitus without complications: Secondary | ICD-10-CM

## 2019-06-15 DIAGNOSIS — H26492 Other secondary cataract, left eye: Secondary | ICD-10-CM

## 2019-06-15 NOTE — Progress Notes (Signed)
Outpatient Visit      Patient name: Dale Schultz  DOB: 03-08-1976       Age: 43 y.o.  MR#: [id number]    Encounter Date: 06/15/2019    Subjective:      Chief Complaint   Patient presents with    Blurred Vision     HPI      Dale Schultz is a 42 y.o. male here for 5 month follow up for history of     DM, s/p [place] w/IOL OS (Dr. Park,01/2011) and PCO OS. Pt states that his   vision is doing better since his last exam OS. Vision in his right eye is   stable. Denies any pain.    Diagnosed with diabetes: ~15 years ago  BG: Did not check  Lab Results       Component                Value               Date                       HA1C                     8.9 (H)             01/12/2019              Ocular meds: None          Last edited by Jacelyn Grip, OD on 06/15/2019  3:22 PM. (History)          has a current medication list which includes the following prescription(s): victoza, metformin, atorvastatin, canagliflozin, citalopram, insulin pen needle, alcohol swabs, bayer contour, lancets ultra thin, glipizide, and flucelvax quadrivalent.     is allergic to penicillins.      Past Medical History:   Diagnosis Date    Anxiety     Diabetes mellitus     HLD (hyperlipidemia)       History reviewed. No pertinent surgical history.     Specialty Problems        Ophthalmology Problems    Diabetes mellitus               ROS     Positive for: Endocrine, Eyes    Negative for: Constitutional, Gastrointestinal, Neurological, Skin,   Genitourinary, Musculoskeletal, HENT, Cardiovascular, Respiratory,   Psychiatric, Allergic/Imm, Heme/Lymph    Last edited by Clent Jacks R on 06/15/2019  3:00 PM. (History)         Objective:     Base Eye Exam     Visual Acuity (Snellen - Linear)       Right Left    Dist sc 20/40 20/20 -2    Near cc J1+ J5          Tonometry (Tonopen, 3:14 PM)       Right Left    Pressure 22 22          Pupils       Shape React APD    Right Round Brisk None    Left Round Brisk None          Visual Fields  (Counting fingers)       Left Right     Full Full          Extraocular Movement       Right Left     Full Full  Neuro/Psych     Oriented x3: Yes    Mood/Affect: Normal          Dilation     Both eyes: 2.5% Phenylephrine, 1.0% Tropicamide @ 3:14 PM            Slit Lamp and Fundus Exam     External Exam       Right Left    External Normal ocular adnexae, lacrimal gland & drainage, orbits Normal ocular adnexae, lacrimal gland & drainage, orbits          Slit Lamp Exam       Right Left    Lids/Lashes Normal structure & position Normal structure & position    Conjunctiva/Sclera Normal bulbar/palpebral, conjunctiva, sclera Normal bulbar/palpebral, conjunctiva, sclera    Cornea Normal epithelium, stroma, endothelium, tear film Normal epithelium, stroma, endothelium, tear film    Anterior Chamber Clear & deep Clear & deep    Iris Normal shape, size, morphology Normal shape, size, morphology    Lens Normal cortex, nucleus, anterior/posterior capsule, clarity PCIOL, 2-3+ PCO encroaching to the center from superior     Vitreous Clear Clear          Fundus Exam       Right Left    Disc Normal size, appearance, nerve fiber layer Normal size, appearance, nerve fiber layer    C/D Ratio 0.3 0.3    Macula Normal, (-)CME Normal, (-)CME    Vessels Normal, (-)hemes, cws, exudates, irma, vb, NVE Normal, (-)hemes, cws, exudates, irma, vb, NVE    Periphery Normal Normal            Refraction     Manifest Refraction       Sphere Cylinder Axis Dist VA    Right -0.75 -1.25 100 20/20    Left [place] -0.50 165 20/20                        No annotated images are attached to the encounter.      Assessment/Plan:      1. PCO (posterior capsular opacification), left     2. Diabetes mellitus type 2 without retinopathy           PLAN:     1. Pt's vision still good, no problems with glare. Continue to monitor. 1 year or sooner with changes to vision. *If pt calls with change to vision in the left eye, please schedule for YAG.   2. Pt educated  on findings and on the importance of maintaining good blood sugar control and follow up with PCP. Monitor with annual dilated exam.

## 2019-07-01 ENCOUNTER — Ambulatory Visit: Payer: BLUE CROSS/BLUE SHIELD | Attending: Endocrinology | Admitting: Endocrinology

## 2019-07-01 VITALS — BP 149/87 | HR 94 | Ht 70.0 in | Wt 200.0 lb

## 2019-07-01 DIAGNOSIS — L749 Eccrine sweat disorder, unspecified: Secondary | ICD-10-CM | POA: Insufficient documentation

## 2019-07-01 DIAGNOSIS — E1165 Type 2 diabetes mellitus with hyperglycemia: Secondary | ICD-10-CM | POA: Insufficient documentation

## 2019-07-01 LAB — POCT HEMOGLOBIN A1C: Hemoglobin A1C,POC: 7.3 % — ABNORMAL HIGH

## 2019-07-01 NOTE — Progress Notes (Signed)
Endocrinology, Diabetes, and Metabolism New Patient Referral for Diabetes.    Consult requested by: Raiford NobleMahler, Stewart, MD      HPI:    This is a 43 y.o. male with a history of Type 2 Diabetes for several years.  Known complications include .    He has really fluctuated with a1c levels.  He can go from 6% to 9%  He lost about 20 lbs.  He was switched from glipizide to invokana.  He is good at exercising a couple times a week.  He does admit to some dietary indiscretions at times especially late at night after some drinks.    He feels good except having some sweating.  It ahs been going on for years.  It is intermittent.  He is unsure if it predates the celexa.  He has not every checked a bg during a sweating episode.  No palpitations or tremors.    Current diabetes regimen:  Invokana 100 mg daily  Victoza 1.8 mg daily  Metformin 1000 mg bid    Diabetic medications are taken appropriately.  There are no missed doses a week.  Hypoglycemia occurs never as far as he knows.       Exercise and diet habits: exercises a couple tiems a week. Is girls lacrosse coach    Last dilated eye exam:utd  Foot care:n/a  Last dental appointment: utd  The patient denies and CP, sob, polyuria, polydipsia, palpitations, major weight changes. Please see remaining ROS below.      Past Medical History:   Diagnosis Date    Anxiety     Diabetes mellitus     HLD (hyperlipidemia)      No past surgical history on file.  Family History   Problem Relation Age of Onset    Conversion Other         [id number]^Essential Hypertension^401.9^Active^    Conversion Other         [id number]^Type II Diabetes Mellitus^250.00^Active^    High Blood Pressure Mother      Social History     Socioeconomic History    Marital status: Married     Spouse name: Not on file    Number of children: Not on file    Years of education: Not on file    Highest education level: Not on file   Tobacco Use    Smoking status: Never Smoker    Smokeless tobacco: Never Used    Substance and Sexual Activity    Alcohol use: Not on file    Drug use: Not on file    Sexual activity: Not on file   Other Topics Concern    Not on file   Social History Narrative    Not on file       Allergies:   Allergies   Allergen Reactions    Penicillins Hives     Created by Conversion - 0;        Current Outpatient Medications on File Prior to Visit   Medication Sig Dispense Refill    VICTOZA 18 MG/3ML injection INJECT 1.8MG  SUBCUTANEOUSLY (UNDER THE SKIN) EVERY DAY 9 mL 5    metFORMIN (GLUCOPHAGE-XR) 750 MG 24 hr tablet TAKE 2 TABLETS BY MOUTH EVERY DAY WITH BREAKFAST. SWALLOW WHOLE, DO NOT BREAK, CRUSH OR CHEW 180 tablet 3    atorvastatin (LIPITOR) 20 MG tablet TAKE 1 TABLET BY MOUTH EVERY DAY 90 tablet 5    canagliflozin (INVOKANA) 300 MG tablet Take 1 tablet (300 mg total) by mouth  daily (before breakfast) 90 tablet 3    citalopram (CELEXA) 20 MG tablet TAKE 1 TABLET BY MOUTH EVERY DAY 90 tablet 3    insulin pen needle (B-D UF III MINI PEN NEEDLES) 31G X 5 MM USE AS DIRECTED 100 each 5    FLUCELVAX QUADRIVALENT 0.5 ML syringe TO BE GIVEN BY PHARMACIST PER STANDING ORDER  0    alcohol swabs pads Use as directed 100 each 5    BAYER CONTOUR test strip tes 2-3 times daily as needed for 250.02 100 each 5    LANCETS ULTRA THIN MISC Test 3 times daily for 250.02 100 each 5     No current facility-administered medications on file prior to visit.      -  Lab Results   Component Value Date    HA1C 8.9 (H) 01/12/2019       Review of Systems:  CONSTITUTIONAL:  Appetite good, no fevers, night sweats or weight loss  HEAD: No headache, dizziness, or syncope  CV: No chest pain, shortness of breath, or edema.  RESPIRATORY:  No sob, cough, or wheezing.  GI:  No nausea, vomiting, abdominal pain, or change in bowel habits  GU:  No dysuria, urgency, or incontinence  NEURO:  No mental status changes, motor weakness, or sensory changes  PSYCH:  No depression or anxiety  MS:  No joint pain, swelling, or  musculoskeletal deformities  SKIN:  No rashes  ENDOCRINE:  No polyuria, polydipsia, or heat intolerance.      Physical Examination:   Vitals:    07/01/19 0908   BP: 149/87   Pulse: 94   Weight: 90.7 kg (200 lb)   Height: 1.778 m (5\' 10" )     Body mass index is 28.7 kg/m.  Wt Readings from Last 3 Encounters:   07/01/19 90.7 kg (200 lb)   10/11/18 100.2 kg (221 lb)   07/12/18 97.5 kg (215 lb)       CONSTITUTIONAL:  well developed, well nourished, no acute distress  HEENT: no periorbital edema   NECK: supple, no thyromegaly  HEART/VASCULAR: RRR  EXTREMITIES:   Joints without deformity or synovitis. Foot exam deferred.  NEUROLOGICAL: Alert and oriented x 3.  No tremor noted.  SKIN:  No rashes.   ENDOCRINE: No supraclavicular fat pads, facial plethora, moon facies      Labs and Imaging:    I personally reviewed and confirmed all laboratory and radiology testing listed below:      Lab results: 10/11/18  1158   Sodium 136   Potassium 4.6   Chloride 96   CO2 26   UN 15   Creatinine 0.86   GFR,Caucasian 105   GFR,Black 121   Glucose 311*   Calcium 9.3     Poct a1c 07/01/2019 7.3%    Lab Results   Component Value Date    HA1C 8.9 (H) 01/12/2019       Lab Results   Component Value Date    ALT 52 (H) 01/12/2019    AST 36 01/12/2019     Lab Results   Component Value Date    CHOL 128 10/11/2018    HDL 36 (L) 10/11/2018    LDLC 44 10/11/2018    TRIG 238 (!) 10/11/2018    [place] 3.6 10/11/2018     Lab Results   Component Value Date    TSH 2.51 03/18/2011       Lab Results   Component Value Date    WBC 7.8  10/17/2009    HGB 15.7 10/17/2009    HCT 43 10/17/2009    MCV 84 10/17/2009    PLT 216 10/17/2009       Assessment: This is a 43 y.o. male with type 2 diabetes uncontrolled with hyperglycemia.  Goal A1c is <7%. Now that he is on SGLT2 therapy he is losing more weight and a1c is coming down. Sweating is chronic. Unlikely to be thyroid related as tfts were normal in past. It is also likley to be celexa  Plan:   1. Changes in diabetic  regimen are as follows:  -continue current regimen  -check a1c in 3 months  2. BP is not at goal on current therapy. Needs reassassement at next visit.  Annual uma  3. Dyslipidemia: on statin therapy per guideline.  4. Recommend annual dilated eye exams, and biannual dental exams for routine health maintenance.  5. Sweating: check tsh, if gets worse or change in symptoms may need work up or titration off celexa.  Follow up: as needed.  He can follow up with his pcp for diabetes control      Sampson Si, MD  Department of Endocrinology  [address], [address]  [place], [place] [address]  Phone: [phone]  Fax: [phone]  Pager: [phone]

## 2019-07-04 ENCOUNTER — Ambulatory Visit: Payer: BLUE CROSS/BLUE SHIELD | Admitting: Internal Medicine

## 2019-07-04 DIAGNOSIS — R61 Generalized hyperhidrosis: Secondary | ICD-10-CM

## 2019-07-04 DIAGNOSIS — I10 Essential (primary) hypertension: Secondary | ICD-10-CM

## 2019-07-04 DIAGNOSIS — F419 Anxiety disorder, unspecified: Secondary | ICD-10-CM

## 2019-07-04 DIAGNOSIS — E119 Type 2 diabetes mellitus without complications: Secondary | ICD-10-CM

## 2019-07-04 NOTE — Progress Notes (Signed)
Telephone Visit     This is an established patient visit.    Location of Telemedicine Provider: home / other    Reason for visit: HTN and DM follow up     HPI:  Dale Schultz is a pleasant 43 y.o. man with hx of DM, HTN, anxiety, HLD presenting for follow up.     He met with Endo and no changes were made to his DM regimen at that time. His A1c has improved to 7.3 from 8.4 previously.  Overall he was happy to hear that his current regimen was the recommended course of treatment.  He has lost about 20 pounds over the last few months intentionally.  Overall he is feeling well with his current regimen of medications.     HTN- his BP was above goal at his visit with Endo.  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  He denies lightheadedness, dizziness, headache, vision changes, chest pain.    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  He continues on the Celexa.    Excess sweating-this is been present his whole life, does not note significant changes over the last few years and no appreciable difference since starting the Celexa.  He was unable to get the lab work done after his visit with endocrinology but will go to the lab at some point this week.    Patient's problem list, allergies, and medications were reviewed and updated as appropriate. Please see the EHR for full details.    Physical Exam:    This visit was performed during a pandemic event, thus the physical exam was not performed.    Assessment Plan:  Dale Schultz is a pleasant 43 y.o. man with hx of DM, HTN, anxiety, HLD presenting for follow up.    Type 2 diabetes-his A1c has improved down to 7.3 we will continue his current regiment of Invokana  Metformin and Victoza.  Continue on atorvastatin.  Commended on weight loss.  We will follow-up in 3 months to repeat A1c and assure that it has gone below goal of less than 7.    Hypertension-he will obtain a home blood pressure cuff so that he can check his blood pressure on a daily basis and send readings via MyChart to the office.  If his blood pressure readings are elevated above 130/80 at home advised that we would recommend starting a medication likely an ACE or an ARB given his underlying type 2 diabetes.  Alternatives would be amlodipine.    Anxiety/excess sweating-notably this is been present for most of his life, given his clinical benefit from Celexa we will hold on down titrating this medication.  Advised him to obtain the lab work for thyroid function test at his earliest convenience.    The plan was discussed with the patient and the patient/patient rep demonstrated understanding to the provider's satisfaction.    Consent was previously obtained from the patient to complete this telephone consult; including the potential for financial liability.    11-20 minutes were spent on the phone with the patient, patient representatives, and/or other attendees.  Chinita Greenland, MD

## 2019-08-15 ENCOUNTER — Other Ambulatory Visit
Admission: RE | Admit: 2019-08-15 | Discharge: 2019-08-15 | Disposition: A | Discharge status: Home / Self Care | Payer: BLUE CROSS/BLUE SHIELD | Source: Ambulatory Visit | Attending: Endocrinology | Admitting: Endocrinology

## 2019-08-15 DIAGNOSIS — E1165 Type 2 diabetes mellitus with hyperglycemia: Secondary | ICD-10-CM | POA: Insufficient documentation

## 2019-08-15 DIAGNOSIS — R7401 Elevation of levels of liver transaminase levels: Secondary | ICD-10-CM

## 2019-08-15 DIAGNOSIS — E119 Type 2 diabetes mellitus without complications: Secondary | ICD-10-CM | POA: Insufficient documentation

## 2019-08-15 LAB — COMPREHENSIVE METABOLIC PANEL
ALT: 44 U/L (ref 0–50)
AST: 37 U/L (ref 0–50)
Albumin: 4.7 g/dL (ref 3.5–5.2)
Alk Phos: 37 U/L — ABNORMAL LOW (ref 40–130)
Anion Gap: 10 (ref 7–16)
Bilirubin,Total: 0.4 mg/dL (ref 0.0–1.2)
CO2: 29 mmol/L — ABNORMAL HIGH (ref 20–28)
Calcium: 10 mg/dL (ref 8.6–10.2)
Chloride: 102 mmol/L (ref 96–108)
Creatinine: 1.05 mg/dL (ref 0.67–1.17)
GFR,Black: 98 *
GFR,Caucasian: 85 *
Glucose: 154 mg/dL — ABNORMAL HIGH (ref 60–99)
Lab: 13 mg/dL (ref 6–20)
Potassium: 4.6 mmol/L (ref 3.3–5.1)
Sodium: 141 mmol/L (ref 133–145)
Total Protein: 6.8 g/dL (ref 6.3–7.7)

## 2019-08-15 LAB — MULTIPLE ORDERING DOCS

## 2019-08-15 LAB — TSH: TSH: 1.29 u[IU]/mL (ref 0.27–4.20)

## 2019-08-15 LAB — HEMOGLOBIN A1C: Hemoglobin A1C: 7.6 % — ABNORMAL HIGH

## 2019-08-15 LAB — T4, FREE: Free T4: 1 ng/dL (ref 0.9–1.7)

## 2019-10-03 ENCOUNTER — Ambulatory Visit: Payer: BLUE CROSS/BLUE SHIELD | Attending: Internal Medicine | Admitting: Internal Medicine

## 2019-10-03 ENCOUNTER — Encounter: Payer: Self-pay | Admitting: Internal Medicine

## 2019-10-03 VITALS — BP 110/78 | HR 72 | Temp 97.3°F | Ht 70.0 in | Wt 208.7 lb

## 2019-10-03 DIAGNOSIS — E119 Type 2 diabetes mellitus without complications: Secondary | ICD-10-CM | POA: Insufficient documentation

## 2019-10-03 DIAGNOSIS — F419 Anxiety disorder, unspecified: Secondary | ICD-10-CM | POA: Insufficient documentation

## 2019-10-03 DIAGNOSIS — E785 Hyperlipidemia, unspecified: Secondary | ICD-10-CM | POA: Insufficient documentation

## 2019-10-03 LAB — POCT HEMOGLOBIN A1C: Hemoglobin A1C,POC: 7.6 % — ABNORMAL HIGH

## 2019-10-03 MED ORDER — METFORMIN HCL 750 MG PO TB24 *I*
2250.0000 mg | ORAL_TABLET | Freq: Every day | ORAL | 3 refills | Status: DC
Start: 2019-10-03 — End: 2019-10-06

## 2019-10-03 NOTE — Progress Notes (Signed)
[facility] Follow-up Note     ReasonReason For Visit:   Follow up     HPI:      Dale Schultz is 44 y.o. year old male coming in to discuss the following issues:    Type 2 DM- he continues on Invokana 300 mg, Victoza 1.8 mg, and metformin 1500 mg daily. Most recent A1c was 7.6 in December.     Anxiety- TSH was WNL. Notes this year has been stressful. 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 Has not been exercising much. Still taking Celexa.     HLD- he continues on atorvastatin.     He had first dose COVID vaccine a few weeks ago. He felt well afterwards.     Past Medical History:   Diagnosis Date    Anxiety     Diabetes mellitus     HLD (hyperlipidemia)          Review of Systems:     12 point ROS negative except for HPI above.     Medications/Allergies/Immunizations:     Medication list reviewed and reconciled this visit in the EMR. Allergy list confirmed in the EMR.    Current Outpatient Medications   Medication Sig    metFORMIN (GLUCOPHAGE-XR) 750 MG 24 hr tablet Take 3 tablets (2,250 mg total) by mouth daily (with breakfast) Swallow whole. Do not crush, break, or chew.    atorvastatin (LIPITOR) 20 MG tablet TAKE 1 TABLET BY MOUTH EVERY DAY    canagliflozin (INVOKANA) 300 MG tablet Take 1 tablet (300 mg total) by mouth daily (before breakfast)    citalopram (CELEXA) 20 MG tablet TAKE 1 TABLET BY MOUTH EVERY DAY    VICTOZA 18 MG/3ML injection INJECT 1.8MG  SUBCUTANEOUSLY (UNDER THE SKIN) EVERY DAY    insulin pen needle (B-D UF III MINI PEN NEEDLES) 31G X 5 MM USE AS DIRECTED    FLUCELVAX QUADRIVALENT 0.5 ML syringe TO BE GIVEN BY PHARMACIST PER STANDING ORDER    alcohol swabs pads Use as directed    BAYER CONTOUR test strip tes 2-3 times daily as needed for 250.02    LANCETS ULTRA THIN MISC Test 3 times daily for 250.02         Past Medical History/Family History/Social History:     Past Medical History, Family History, and Social History reviewed, confirmed, and  updated as appropriate this visit in the EMR.     Physical Exam:     Vitals:    10/03/19 1022   BP: 110/78   BP Location: Left arm   Patient Position: Sitting   Pulse: 72   Temp: 36.3 C (97.3 F)   TempSrc: Temporal   Weight: 94.7 kg (208 lb 11.2 oz)   Height: 1.778 m (5\' 10" )       BP Readings from Last 3 Encounters:   10/03/19 110/78   07/01/19 149/87   10/11/18 132/82     Wt Readings from Last 3 Encounters:   10/03/19 94.7 kg (208 lb 11.2 oz)   07/01/19 90.7 kg (200 lb)   10/11/18 100.2 kg (221 lb)       Vitals Signs: ZOX:WRUE mass index is 29.95 kg/m., otherwise as above, reviewed with patient    General: No acute distress  CV: RRR, no MRG  Resp: CTA b/l. N-WOB.     Labs and Imaging:   Labs and Imaging results reviewed with patient, A1c 7.6.       Assessment and Plan:  Diagnoses and all orders for this visit:    Type 2 diabetes mellitus without complication, without long-term current use of insulin- Continue current dose of Victoza 1.8 mg, Invokana 300 mg, and will increase metformin.   -     POCT Hemoglobin A1C  -     metFORMIN (GLUCOPHAGE-XR) 750 MG 24 hr tablet; Take 3 tablets (2,250 mg total) by mouth daily (with breakfast) Swallow whole. Do not crush, break, or chew.    Hyperlipidemia, unspecified hyperlipidemia type  - continue Lipitor    Anxiety  - continue Celexa.     Other orders  -     POCT HEMOGLOBIN A1C    S/p first dose of COVID Vaccine.      Handouts Given: Patient educational materials distributed by print-out and/or inserted into AVS   Follow-up:   RTC:  3 months for repeat A1c.   Author:   Raiford Noble, MD   General Medicine Division  [facility] [phone]  10/03/2019 8:00 AM

## 2019-10-05 ENCOUNTER — Other Ambulatory Visit: Payer: Self-pay | Admitting: Internal Medicine

## 2019-10-05 DIAGNOSIS — E119 Type 2 diabetes mellitus without complications: Secondary | ICD-10-CM

## 2019-10-05 DIAGNOSIS — Z794 Long term (current) use of insulin: Secondary | ICD-10-CM

## 2019-10-05 NOTE — Telephone Encounter (Signed)
Medication request routed to Dr Al Corpus for processing 10/05/2019 8:52 AM

## 2019-10-06 ENCOUNTER — Telehealth: Payer: Self-pay | Admitting: Internal Medicine

## 2019-10-06 MED ORDER — METFORMIN HCL 1000 MG (OSM) PO TB24 *A*
2000.0000 mg | ORAL_TABLET | Freq: Every day | ORAL | 3 refills | Status: DC
Start: 2019-10-06 — End: 2020-09-21

## 2019-10-06 MED ORDER — METFORMIN HCL 500 MG (OSM) PO TB24 *I*
500.0000 mg | ORAL_TABLET | Freq: Every day | ORAL | 3 refills | Status: DC
Start: 2019-10-06 — End: 2021-09-26

## 2019-10-06 NOTE — Telephone Encounter (Signed)
Copied from CRM [id number]. Topic: Medications/Prescriptions - Medication Question/Problem  >> Oct 06, 2019  3:30 PM Tye [place] wrote:  Idalia Needle from [facility] is calling with a question on the medication metFORMIN (GLUCOPHAGE-XR) 750 MG 24 hr tablet   The question is: maximum daily dosage for this medication is 2000mg , could fortamet be used as an alternative being it allows for a higher dosage?    Please return her call at [phone] to advise.

## 2019-11-19 ENCOUNTER — Other Ambulatory Visit: Payer: Self-pay | Admitting: Internal Medicine

## 2019-11-19 DIAGNOSIS — E119 Type 2 diabetes mellitus without complications: Secondary | ICD-10-CM

## 2019-11-21 NOTE — Telephone Encounter (Signed)
Dr. Al Corpus unavailable, refill request routed to Arlean Hopping, PA 11/21/2019 8:48 AM

## 2019-12-12 ENCOUNTER — Encounter: Payer: Self-pay | Admitting: Internal Medicine

## 2019-12-19 ENCOUNTER — Ambulatory Visit: Payer: BLUE CROSS/BLUE SHIELD | Admitting: Internal Medicine

## 2019-12-20 ENCOUNTER — Ambulatory Visit: Payer: BLUE CROSS/BLUE SHIELD | Admitting: Internal Medicine

## 2019-12-20 ENCOUNTER — Telehealth: Payer: Self-pay | Admitting: Internal Medicine

## 2019-12-20 NOTE — Telephone Encounter (Signed)
Writer called pt today /left VM message to My Chart or return call to reschedule today's appointment with Dr. Al Corpus due to change in providers schedule.

## 2019-12-21 ENCOUNTER — Ambulatory Visit: Payer: BLUE CROSS/BLUE SHIELD | Admitting: Internal Medicine

## 2019-12-28 ENCOUNTER — Encounter: Payer: Self-pay | Admitting: Internal Medicine

## 2019-12-28 ENCOUNTER — Ambulatory Visit: Payer: BLUE CROSS/BLUE SHIELD | Admitting: Internal Medicine

## 2019-12-28 VITALS — BP 138/84 | HR 103 | Temp 97.0°F | Ht 70.0 in | Wt 199.3 lb

## 2019-12-28 DIAGNOSIS — F32A Depression, unspecified: Secondary | ICD-10-CM

## 2019-12-28 DIAGNOSIS — F419 Anxiety disorder, unspecified: Secondary | ICD-10-CM

## 2019-12-28 DIAGNOSIS — E785 Hyperlipidemia, unspecified: Secondary | ICD-10-CM

## 2019-12-28 DIAGNOSIS — E119 Type 2 diabetes mellitus without complications: Secondary | ICD-10-CM

## 2019-12-28 DIAGNOSIS — F329 Major depressive disorder, single episode, unspecified: Secondary | ICD-10-CM

## 2019-12-28 MED ORDER — CITALOPRAM HYDROBROMIDE 20 MG PO TABS *I*
30.0000 mg | ORAL_TABLET | Freq: Every day | ORAL | 3 refills | Status: DC
Start: 2019-12-28 — End: 2020-01-11

## 2019-12-28 MED ORDER — BUPROPION HCL 150 MG PO TB24 *I*
150.0000 mg | ORAL_TABLET | Freq: Every morning | ORAL | 5 refills | Status: DC
Start: 2019-12-28 — End: 2020-02-09

## 2019-12-28 NOTE — Progress Notes (Signed)
[facility] Follow-up Note     ReasonReason For Visit:   Anxiety/depression     HPI:      Dale Schultz is 44 y.o. year old male coming in to discuss the following issues:    He presents with his wife, Dale Schultz. He reports "I am not doing well." notes that he has struggled with depression and anxiety for years, and that he has continued to function with work and at home but things have become too overwhelming. He notes increasing thoughts of being better off dead. He has expressed times to his wife when he is driving home about "driving off a bridge," or into a tree. He denies plan at this time, and has been talking with his therapist about these thoughts as well. They have talked briefly about partial hospitalizations but he is not sure this would be necessary now. He is interested in meeting with Psychiatrist. He has been more irritable, anxious, lack of interest, feeling sad, hopeless, difficulty concentrating, fatigued but having difficulty with slepe, and HA. He continues to take the Celexa, but this has not seemed to help recently.     His most recent A1c was 7.6. he was able to get below 200 lbs recently, and he is down several pants sizes.      Dale Schultz his Psychologist- cell [phone].     Physical Exam:     Vitals:    12/28/19 1203   BP: 138/84   BP Location: Left arm   Patient Position: Sitting   Cuff Size: large adult   Pulse: 103   Temp: 36.1 C (97 F)   TempSrc: Temporal   SpO2: 97%   Weight: 90.4 kg (199 lb 4.8 oz)   Height: 1.778 m (5\' 10" )     Recent Review Flowsheet Data     PHQ Scores 12/28/2019 10/09/2017    PSQ2 Q2 - Down, Depressed, Hopeless Y -    PHQ Q9 - Better Off Dead 2 0    PHQ Calculated Score 26 4            Vitals Signs: EXB:MWUX mass index is 28.6 kg/m., otherwise as above, reviewed with patient    General: NAD, tearful during interview.   Psych:   Appearance:  age appropriate   Behavior:  normal   Speech:  normal pitch and normal volume   Mood:  Anxious and  Depressed   Affect:  mood-congruent   Thought Process:  Normal   Thought Content:  suicidal and passive, better off deada    Sensorium:  person, place, time/date and situation   Cognition:  grossly intact   Insight:  Age appropriate   Judgment:  Age appropriate           Assessment and Plan:   Dale Schultz is a 44 y.o. man with hx of anxiety, type 2 DM, HLD presenting with acute worsening of his depression and anxiety.   Diagnoses and all orders for this visit:    Anxiety/Depression, unspecified depression type- offered support and encouragement to his wife. Will touch base with his therapist [name] to discuss additional input on his current state, and follow up plan. Discussed potential for partial hospitalization if things worsen. Safety plan discussed, mobile crisis discussed. Will plan to up titrate Celexa to 30 mg, and add Wellbutrin to help with his anhedonia.   -     citalopram (CELEXA) 20 MG tablet; Take 1.5 tablets (30 mg total) by mouth daily  -  buPROPion (WELLBUTRIN XL) 150 MG 24 hr tablet; Take 1 tablet (150 mg total) by mouth every morning Swallow whole. Do not crush, break, or chew.  - will talk with [name] to discuss his current regimen and if there are additional medications that could be helpful     Type 2 diabetes mellitus without complication, without long-term current use of insulin  -     Comprehensive metabolic panel; Future  -     Hemoglobin A1c; Future    Hyperlipidemia, unspecified hyperlipidemia type  -     Lipid Panel (Reflex to Direct  LDL if Triglycerides more than 400); Future        Handouts Given: Patient educational materials distributed by print-out and/or inserted into AVS   Follow-up:   RTC:  2 weeks for follow up depression/anxiety        Author:   Raiford Noble, MD   General Medicine Division  [facility] [phone]  12/28/2019 8:25 AM            Additional Objective Data Listed Below.    Review of Systems:     12 point ROS negative except for HPI above.          Medications/Allergies/Immunizations:     Medication list reviewed and reconciled this visit in the EMR. Allergy list confirmed in the EMR.    Current Outpatient Medications   Medication Sig    VICTOZA 18 MG/3ML injection pen INJECT 1.8MG  SUBCUTANEOUSLY (UNDER THE SKIN) EVERY DAY    citalopram (CELEXA) 20 MG tablet Take 1.5 tablets (30 mg total) by mouth daily    buPROPion (WELLBUTRIN XL) 150 MG 24 hr tablet Take 1 tablet (150 mg total) by mouth every morning Swallow whole. Do not crush, break, or chew.    metFORMIN (FORTAMET) 1000 MG (OSM) 24 hr tablet Take 2 tablets (2,000 mg total) by mouth daily (with dinner) In addition to 500 mg dose for total of 2500 mg daily. Swallow whole. Do not cut, crush, or chew.    metFORMIN (FORTAMET) 500 MG (OSM) 24 hr tablet Take 1 tablet (500 mg total) by mouth daily (with dinner) Take in addition to 2000 mg Dose for total of 2500 mg daily. Swallow whole. Do not cut, crush, or chew.    insulin pen needle (B-D UF III MINI PEN NEEDLES) 31G X 5 MM USE AS DIRECTED    atorvastatin (LIPITOR) 20 MG tablet TAKE 1 TABLET BY MOUTH EVERY DAY    canagliflozin (INVOKANA) 300 MG tablet Take 1 tablet (300 mg total) by mouth daily (before breakfast)    FLUCELVAX QUADRIVALENT 0.5 ML syringe TO BE GIVEN BY PHARMACIST PER STANDING ORDER    alcohol swabs pads Use as directed    BAYER CONTOUR test strip tes 2-3 times daily as needed for 250.02    LANCETS ULTRA THIN MISC Test 3 times daily for 250.02         Past Medical History/Family History/Social History:     Past Medical History:   Diagnosis Date    Anxiety     Diabetes mellitus     HLD (hyperlipidemia)        Past Medical History, Family History, and Social History reviewed, confirmed, and updated as appropriate this visit in the EMR.     Labs and Imaging:   Labs and Imaging results reviewed with patient.

## 2019-12-30 ENCOUNTER — Telehealth: Payer: Self-pay | Admitting: Internal Medicine

## 2019-12-30 NOTE — Telephone Encounter (Signed)
Writer called patient 2x times per  Dale Schultz to schedule an NPV with BH Katie Writer left a Pharmacologist sent a Proofreader to [employer] as an Financial planner only

## 2019-12-30 NOTE — Telephone Encounter (Signed)
-----   Message from [name], NP sent at 12/30/2019 11:53 AM EDT -----  Including Neysa Bonito- referral for this patient is in, but it is a bit time sensitive. Can you schedule for a NPV with me whenever you are able? Thank you!    Katie   ----- Message -----  From: Raiford Noble, MD  Sent: 12/30/2019   8:54 AM EDT  To: [name], NP    Awesome, thank you Katie! Will you reach out, or should I have Chrissy schedule?     ----- Message -----  From: [name], NP  Sent: 12/28/2019   5:09 PM EDT  To: Raiford Noble, MD    Thanks for the heads up, I am okay with seeing him, just would normally limit this to a couple sessions. If it seems he will need a long-term psychiatrist I can also give him a list of some community providers.    Katie   ----- Message -----  From: Raiford Noble, MD  Sent: 12/28/2019   4:26 PM EDT  To: [name], NP    Katina Dung, I know that you typically only see resident patients, but this gentleman has been struggling with anxiety and depression. Him and his wife are looking for longer term psychiatrist but this has been difficult, and he is in a bit of a crisis. He is safe, has a good therapist. I am increasing his celexa to 30 mg and adding wellbutrin, but I would be appreciative to have your input and consult with Xavier and his wife if possible.    Thanks,   Roseanne Reno

## 2020-01-02 ENCOUNTER — Ambulatory Visit: Payer: BLUE CROSS/BLUE SHIELD | Admitting: Internal Medicine

## 2020-01-06 ENCOUNTER — Telehealth: Payer: Self-pay

## 2020-01-06 NOTE — Telephone Encounter (Signed)
Writer left patient a detailed message with below insurance benefits:    Verified benefits online: [employer]: No ded, $20 co-pay, $4200/OOP ($541.14), Auth is not required. Angie [id number]

## 2020-01-11 ENCOUNTER — Encounter: Payer: Self-pay | Admitting: Psychiatry

## 2020-01-11 ENCOUNTER — Ambulatory Visit: Payer: BLUE CROSS/BLUE SHIELD | Admitting: Psychiatry

## 2020-01-11 VITALS — BP 130/70 | HR 72 | Temp 96.1°F | Wt 201.5 lb

## 2020-01-11 DIAGNOSIS — F411 Generalized anxiety disorder: Secondary | ICD-10-CM

## 2020-01-11 DIAGNOSIS — F332 Major depressive disorder, recurrent severe without psychotic features: Secondary | ICD-10-CM

## 2020-01-11 MED ORDER — CITALOPRAM HYDROBROMIDE 40 MG PO TABS *I*
40.0000 mg | ORAL_TABLET | Freq: Every day | ORAL | 1 refills | Status: DC
Start: 2020-01-11 — End: 2020-07-05

## 2020-01-11 NOTE — Progress Notes (Signed)
Primary Care Behavioral Health Note    Primary Care Behavioral Health Note:   Length of Session:  60 minutes  Referral Source:  Raiford Noble, MD  HPI:  Joanthan Hlavacek is a 44 y.o. male referred today for evaluation for recent worsening of depression and anxiety.   He presents today with his wife.     He has significant history of anxiety for many years. At least two past episodes of depression but he has been increasingly depressed over the past 3 weeks and "this time it's the worst."     He started citalopram when his wife was pregnant 12 years ago. He came off it 2 years ago to see if he would be okay without it, but had to get back on it within about 6 months because he re-experienced his depression and anxiety symptoms pretty quickly. He restarted it and got into therapy and was doing better for a little while.     However, he is employed as a principal and unfortunately it has been very stressful dealing with the coronavirus pandemic since last spring. He has had long days, has had to make lots of decisions, doesn't have much support at work. Now it is hard to even go to work, states this morning it was very difficult to get out of bed and go to work.     Feels he has some good days but in general he is quite exhausted feeling his mind is constantly going, feels worried about the future, what is going to happen and how things are going to play out. Not finding joy in anything. Feels himself wanting to avoid work responsibilities which is not like him. Dreads having to face another school year if it is anything like this one. Feels he is putting an act on constantly.     He usually likes to exercise but has not had the energy lately. Has not been eating much due to stress and has lost weight. 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 He never takes sick days, feels this might just add more to his workload when he  returns.     He has not been sleeping well. Falls asleep okay but then doesn't stay asleep, waking up too early and unable to fall back asleep. He doesn't feel rested at all.     He has worried about his mortality. Has diabetes and his cousin passed away a couple years ago which made him fear about his own life/death. He has had some suicidal thoughts, but more or less wishes for a temporary escape. He knows he has a lot to live for and does not want to die. He doesn't have a suicidal plan.     Psychiatric History:  Has been seeing a therapist for 2 years, [name] in private practice. Started seeing her due to worrying about his diabetes and mortality.     He has a history of anxiety. Some previous milder episodes of depression.     Has been taking citalopram for almost 12 years. Just got started on bupropion 2 weeks ago. No other trials of antidepressants. Has not seen a psychiatrist.           Drug/Alcohol History:  Not asked today   Social History:  He is a principal for a middle school   He lives with his wife, has two daughters  Family History:  Two brothers and mother with anxiety   Brief Suicide Risk:  New suicidal ideation  Predisposing factors:  Chronic psychiatric condition(s)  Recent Stressful Events: job stress.  Clinical Presentation:  Major depressive symptoms, feeling trapped and suicidal ideation (No suicidal plan or intent)  Opportunities for Treatment Planning:  Able to identify reasons for living, lives with a partner or other family, future oriented, child(ren) in the home, active engagement in treatment, perceived reasons to live are greater than reasons to die and does not view suicide as a personal option  Engagement with attempts to interview/help:  Good  Assessment of reliability of report:  Good  Long-term/Chronic Risk:  Low/moderate  Short-term/Acute Risk:  Low  Synthesis and Rationale for Clinical Judgment of Risk: Describe:  Pt is assessed to be at low short-term suicide risk, despite  high stress and recent passive suicidal thoughts, he has a good support system, is actively seeking treatment, and denies suicidal plan or intent at this time. Pt is assessed to be at low/moderate long-term risk for suicide, due to some demographic factors such as age/gender, high-stress job, and chronic anxiety/depression, though his wife and daughters are longstanding protective factors and he ultimately feels his reasons to live far outweigh reasons to die.   Suicide Care Plan:  Monitoring beyond usual for suicide risk not indicated at this time  Brief Violence Risk:  Patient denies      Medical History:  Patient Active Problem List   Diagnosis Code    Irritable Bowel Syndrome K58.9    Nephrolithiasis N20.0    Hyperlipidemia E78.5    Diabetes mellitus E11.9    Recurrent acute sinusitis J01.91    Anxiety F41.9    Elevated transaminase level R74.01         ROS  Review of Systems   All other systems reviewed and are negative.      Current Medications  Current Outpatient Medications   Medication Sig    VICTOZA 18 MG/3ML injection pen INJECT 1.8MG  SUBCUTANEOUSLY (UNDER THE SKIN) EVERY DAY    citalopram (CELEXA) 20 MG tablet Take 1.5 tablets (30 mg total) by mouth daily    buPROPion (WELLBUTRIN XL) 150 MG 24 hr tablet Take 1 tablet (150 mg total) by mouth every morning Swallow whole. Do not crush, break, or chew.    metFORMIN (FORTAMET) 1000 MG (OSM) 24 hr tablet Take 2 tablets (2,000 mg total) by mouth daily (with dinner) In addition to 500 mg dose for total of 2500 mg daily. Swallow whole. Do not cut, crush, or chew.    metFORMIN (FORTAMET) 500 MG (OSM) 24 hr tablet Take 1 tablet (500 mg total) by mouth daily (with dinner) Take in addition to 2000 mg Dose for total of 2500 mg daily. Swallow whole. Do not cut, crush, or chew.    insulin pen needle (B-D UF III MINI PEN NEEDLES) 31G X 5 MM USE AS DIRECTED    atorvastatin (LIPITOR) 20 MG tablet TAKE 1 TABLET BY MOUTH EVERY DAY    canagliflozin (INVOKANA)  300 MG tablet Take 1 tablet (300 mg total) by mouth daily (before breakfast)    FLUCELVAX QUADRIVALENT 0.5 ML syringe TO BE GIVEN BY PHARMACIST PER STANDING ORDER    alcohol swabs pads Use as directed    BAYER CONTOUR test strip tes 2-3 times daily as needed for 250.02    LANCETS ULTRA THIN MISC Test 3 times daily for 250.02     No current facility-administered medications for this visit.  Results  Recent Review Flowsheet Data     PHQ Scores 01/11/2020 12/28/2019 10/09/2017    PSQ2 Q1 - Interest/Pleasure Y - -    PSQ2 Q2 - Down, Depressed, Hopeless Y Y -    PHQ Q9 - Better Off Dead 1 2 0    PHQ Calculated Score 24 26 4         GAD-7 Score: 18 (01/11/2020  2:02 PM)        Mental Status Exam  Mental Status Exam:   Appearance:  Appears stated age and well-groomed  Orientation:  Alert and oriented X 3  Attitude Toward Interviewer:  Cooperative  Motor Activity:  Normal  Eye Contact:  Good  Speech:  Normal  Mood:  Anxious and depressed  Affect: Restricted.  Thought Process:  Linear/goal directed and logical  Thought Content:  Appropriate  Perception:  No evidence of hallucinations  Current Suicidal Ideation:  Vague/passive suicidal ideation, without intent and without suicidal plan  Current Homicidal Ideation:  Patient denies  Concentration:  Good  Memory:  Recent memory intact and remote memory intact  Fund of Knowledge:  Above average  Judgment:  Good  Insight:  Good      BP 130/70    Pulse 72    Temp 35.6 C (96.1 F) (Temporal)    Wt 91.4 kg (201 lb 8 oz)    BMI 28.91 kg/m       Diagnosis Addressed    ICD-10-CM ICD-9-CM   1. GAD (generalized anxiety disorder)  F41.1 300.02   2. Major depressive disorder, recurrent episode, severe with anxious distress  F33.2 296.33        Assessment:   Ellison Rieth is a 44 y.o. male with prior history of generalized anxiety and at least two previous mild to moderate depressive episodes who presents today with depressed mood and worsening anxiety in setting of increased stress  over the past year due to job as a middle school principal and dealing with the crisis of the coronavirus pandemic. Symptoms have for the first time been interfering with ability to function at work, has difficulty relaxing outside of work, and has had some suicidal ideation (does not wish or intend to die, but has a hopeless feeling that he is "done", "can't take another school year like this," fantasizes about having some type of accident that would result in him being taken away from everything for awhile). He does have a good support system through wife, therapist of 2 years and primary care provider. Pt has had previous response to citalopram which he has been on for many years, now working on increasing dose and bupropion was also added 2 weeks ago. I do suspect he will have response to increasing the citalopram dose due to past response; can safely increase to 40mg  today though I did advise it may take some more time to observe the response to this. Agree with adding bupropion to target some other more specific symptoms- lack of joy/interest, fatigue, poor concentration- but would watch for worsening of anxiety, insomnia, or appetite/weight loss, which he has not experienced any worsening of so far. Pt now seeing his therapist weekly. Discussed whether time out of work may be necessary though pt is ambivalent about this due to the timing in the school year (kids finish on June 7), but could possibly even benefit from a "mental health" sick day or two if needed- he is aware his therapist and PCP would support this as well.  Plan:  - Increase citalopram to 40mg  daily  - Continue Wellbutrin XL 150mg  daily for now. Allow a little more time to observe response. If partial effectiveness, could increase to 300mg  daily.   - If anxiety persists, consider other augmenting options- buspirone, mirtazapine  - Continue therapy with [name]  - Will meet every 2 weeks, monitor progress with GAD-7 and PHQ-9 scores

## 2020-01-12 ENCOUNTER — Ambulatory Visit: Payer: BLUE CROSS/BLUE SHIELD | Attending: Internal Medicine | Admitting: Internal Medicine

## 2020-01-12 ENCOUNTER — Encounter: Payer: Self-pay | Admitting: Internal Medicine

## 2020-01-12 VITALS — BP 131/79 | HR 75 | Temp 95.7°F | Ht 70.0 in | Wt 203.0 lb

## 2020-01-12 DIAGNOSIS — F329 Major depressive disorder, single episode, unspecified: Secondary | ICD-10-CM | POA: Insufficient documentation

## 2020-01-12 DIAGNOSIS — I10 Essential (primary) hypertension: Secondary | ICD-10-CM | POA: Insufficient documentation

## 2020-01-12 DIAGNOSIS — F32A Depression, unspecified: Secondary | ICD-10-CM

## 2020-01-12 DIAGNOSIS — F419 Anxiety disorder, unspecified: Secondary | ICD-10-CM | POA: Insufficient documentation

## 2020-01-12 DIAGNOSIS — E119 Type 2 diabetes mellitus without complications: Secondary | ICD-10-CM | POA: Insufficient documentation

## 2020-01-12 LAB — POCT HEMOGLOBIN A1C: Hemoglobin A1C,POC: 8.7 % — ABNORMAL HIGH

## 2020-01-12 NOTE — Progress Notes (Signed)
[facility] Follow-up Note     ReasonReason For Visit:   Follow up anxiety/depression    HPI:      Dale Schultz is 44 y.o. year old male coming in to discuss the following issues:    Depression/anxiety- he continues to follow with [name] his psychologist. He is taking the Wellbutrin 150 mg, and Celexa was increased to 40 mg. He is now following with Sherlyn Hay while he transitions to more long term psychiatrist. He notes that his mood has been about 50/50. Notes that when he gets to work he will feel a sense of anxiety and worry around 9 am. He often talks to his wife while he is at work and this is around the time that the feeling hits him pretty hard. 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 He is discussing these concerns with Sherlyn Hay and [name].     Physical Exam:     Vitals:    01/12/20 0917   BP: 131/79   Pulse: 75   Temp: 35.4 C (95.7 F)   TempSrc: Temporal   Weight: 92.1 kg (203 lb)   Height: 1.778 m (5\' 10" )       Vitals Signs: IOX:BDZH mass index is 29.13 kg/m., otherwise as above, reviewed with patient    General: NAD, pleasant   Psych: appears anxious, depressed mood and affect.   Appearance:  age appropriate   Behavior:  normal   Speech:  normal pitch and normal volume   Mood:  Anxious and Depressed   Affect:  mood-congruent   Thought Process:  Normal   Thought Content:  normal and passive SI   Sensorium:  person, place, time/date and situation   Cognition:  grossly intact   Insight:  Age appropriate   Judgment:  Age appropriate           Assessment and Plan:     Diagnoses and all orders for this visit:    Depression, unspecified depression type/Anxiety  - offered support and encouragement   - following with Sherlyn Hay  - continue Wellbutrin 150 mg and Celexa 40 mg daily          Essential hypertension  - BP has been well controlled    Type 2 diabetes mellitus without complication, without long-term current use of insulin  -     POCT Hemoglobin A1C  - will discuss his A1c at follow up, as given his current anxiety/depression these numbers are in flux. Encouraged continued medication adherence.     Other orders  -     POCT HEMOGLOBIN A1C        Handouts Given: Patient educational materials distributed by print-out and/or inserted into AVS   Follow-up:   RTC:  4 weeks      Author:   Chinita Greenland, MD   General Medicine Division  [facility] [phone]  01/12/2020 8:07 AM            Additional Objective Data Listed Below.    Review of Systems:     12 point ROS negative except for HPI above.     Medications/Allergies/Immunizations:     Medication list reviewed and reconciled this visit in the EMR. Allergy list confirmed in the EMR.    Current Outpatient Medications  Medication Sig    VICTOZA 18 MG/3ML injection pen INJECT 1.8MG  SUBCUTANEOUSLY (UNDER THE SKIN) EVERY DAY    citalopram (CELEXA) 40 MG tablet Take 1 tablet (40 mg total) by mouth daily    buPROPion (WELLBUTRIN XL) 150 MG 24 hr tablet Take 1 tablet (150 mg total) by mouth every morning Swallow whole. Do not crush, break, or chew.    metFORMIN (FORTAMET) 1000 MG (OSM) 24 hr tablet Take 2 tablets (2,000 mg total) by mouth daily (with dinner) In addition to 500 mg dose for total of 2500 mg daily. Swallow whole. Do not cut, crush, or chew.    metFORMIN (FORTAMET) 500 MG (OSM) 24 hr tablet Take 1 tablet (500 mg total) by mouth daily (with dinner) Take in addition to 2000 mg Dose for total of 2500 mg daily. Swallow whole. Do not cut, crush, or chew.    insulin pen needle (B-D UF III MINI PEN NEEDLES) 31G X 5 MM USE AS DIRECTED    atorvastatin (LIPITOR) 20 MG tablet TAKE 1 TABLET BY MOUTH EVERY DAY    canagliflozin (INVOKANA) 300 MG tablet Take 1 tablet (300 mg total) by mouth daily (before breakfast)    FLUCELVAX QUADRIVALENT 0.5 ML syringe  TO BE GIVEN BY PHARMACIST PER STANDING ORDER    alcohol swabs pads Use as directed    BAYER CONTOUR test strip tes 2-3 times daily as needed for 250.02    LANCETS ULTRA THIN MISC Test 3 times daily for 250.02         Past Medical History/Family History/Social History:     Past Medical History:   Diagnosis Date    Anxiety     Diabetes mellitus     HLD (hyperlipidemia)        Past Medical History, Family History, and Social History reviewed, confirmed, and updated as appropriate this visit in the EMR.     Labs and Imaging:   Labs and Imaging results reviewed with patient.

## 2020-01-25 ENCOUNTER — Encounter: Payer: Self-pay | Admitting: Psychiatry

## 2020-01-25 ENCOUNTER — Ambulatory Visit: Payer: BLUE CROSS/BLUE SHIELD | Admitting: Psychiatry

## 2020-01-25 VITALS — BP 124/80 | HR 84 | Temp 97.0°F | Ht 70.0 in | Wt 200.0 lb

## 2020-01-25 DIAGNOSIS — F332 Major depressive disorder, recurrent severe without psychotic features: Secondary | ICD-10-CM

## 2020-01-25 DIAGNOSIS — F411 Generalized anxiety disorder: Secondary | ICD-10-CM

## 2020-01-25 NOTE — Progress Notes (Signed)
[facility] Nurse Practitioner Psychopharmacology Follow-up Note     Length of Session: 30 minutes.    Diagnosis Addressed    ICD-10-CM ICD-9-CM   1. GAD (generalized anxiety disorder)  F41.1 300.02   2. Major depressive disorder, recurrent episode, severe with anxious distress  F33.2 296.33       Patient Active Problem List   Diagnosis Code    Irritable Bowel Syndrome K58.9    Nephrolithiasis N20.0    Hyperlipidemia E78.5    Diabetes mellitus E11.9    Recurrent acute sinusitis J01.91    Anxiety F41.9    Elevated transaminase level R74.01         Recent History and Response to Medications  Met with pt for Behavioral Health follow up. Patient states he has been having good days and bad days. States today was a very stressful day at work. 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 He also has a couple of vacations planned over the summer.     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     Mentions some stress at home also factoring in right now- concerns about daughter, dog is getting older and may need to be put down soon.        Current use of alcohol or drugs: Drinks a hard cider with dinner and a glass of bourbon later in the evening. Cannabis use also in the evening.      ENERGY: Fair  CONCENTRATION: fair   SLEEP: Insomnia:  Terminal.    APPETITE: Doesn't eat much during the day, more tendency to binge eat at night     WEIGHT: 3 lb loss since last session  SEXUAL FUNCTION: not asked  ENJOYMENT/INTEREST: Able to enjoy 3-day weekend with family, still struggles to fully relax    Current Medications  Current Outpatient  Medications   Medication Sig    VICTOZA 18 MG/3ML injection pen INJECT 1.8MG SUBCUTANEOUSLY (UNDER THE SKIN) EVERY DAY    citalopram (CELEXA) 40 MG tablet Take 1 tablet (40 mg total) by mouth daily    buPROPion (WELLBUTRIN XL) 150 MG 24 hr tablet Take 1 tablet (150 mg total) by mouth every morning Swallow whole. Do not crush, break, or chew.    metFORMIN (FORTAMET) 1000 MG (OSM) 24 hr tablet Take 2 tablets (2,000 mg total) by mouth daily (with dinner) In addition to 500 mg dose for total of 2500 mg daily. Swallow whole. Do not cut, crush, or chew.    metFORMIN (FORTAMET) 500 MG (OSM) 24 hr tablet Take 1 tablet (500 mg total) by mouth daily (with dinner) Take in addition to 2000 mg Dose for total of 2500 mg daily. Swallow whole. Do not cut, crush, or chew.    insulin pen needle (B-D UF III MINI PEN NEEDLES) 31G X 5 MM USE AS DIRECTED    atorvastatin (LIPITOR) 20 MG tablet TAKE 1 TABLET BY MOUTH EVERY DAY    canagliflozin (INVOKANA) 300 MG tablet Take 1 tablet (300 mg total) by mouth daily (before breakfast)    FLUCELVAX QUADRIVALENT 0.5 ML syringe TO BE GIVEN BY PHARMACIST PER STANDING ORDER  alcohol swabs pads Use as directed    BAYER CONTOUR test strip tes 2-3 times daily as needed for 250.02    LANCETS ULTRA THIN MISC Test 3 times daily for 250.02     No current facility-administered medications for this visit.         Side Effects  None    Mental Status  APPEARANCE: Appears stated age, Well-groomed  ATTITUDE TOWARD INTERVIEWER: Cooperative  MOTOR ACTIVITY: WNL (within normal limits)  EYE CONTACT: Direct  SPEECH: Normal rate and tone  AFFECT: Full Range  MOOD: Anxious and Depressed  THOUGHT PROCESS: Normal  THOUGHT CONTENT: No unusual themes  PERCEPTION: No evidence of hallucinations  ORIENTATION: Alert and Oriented X 3.  CONCENTRATION: WNL  MEMORY:   Recent: intact   Remote: intact  COGNITIVE FUNCTION: Average intelligence  JUDGMENT: Intact  IMPULSE CONTROL: Intact  INSIGHT: Good    Risk  Assessment  Self Injury: Patient Denies  Suicidal Ideation: Occasional fleeting passive thoughts when frustrated, no plan or intent  Homicidal Ideation: Patient Denies  Aggressive Behavior: Patient Denies      Results  Recent Review Flowsheet Data     PHQ Scores 01/25/2020 01/11/2020 12/28/2019 10/09/2017    PSQ2 Q1 - Interest/Pleasure Y Y - -    PSQ2 Q2 - Down, Depressed, Hopeless Y Y Y -    PHQ Q9 - Better Off Dead 0 1 2 0    PHQ Calculated Score _0 GAD-7 Score: 9 (01/25/2020  2:07 PM)        Assessment  FORMULATION:   Pt is demonstrating gradual improvement in symptoms. Pt still having some good and bad days. However, PHQ-9 and GAD-7 scores have shown improvement over past 2 weeks and he is subjectively noticing some differences. Pt continues to struggle with work stress and more seriously considering a leave of absence this summer, perhaps with a goal of doing more intensive therapy doing that time. Pt reports tolerability of medications. Since symptoms are moving in the right direction will keep medications the same for now and allow more time to observe response. Pt agreeable with this plan.       Recommendations/Plan and Rationale  PLAN:   - Continue citalopram 49m daily  - Continue bupropion XL 1566mdaily  - Continue therapy with Ju[facility] Harbor

## 2020-01-31 ENCOUNTER — Encounter: Payer: Self-pay | Admitting: Internal Medicine

## 2020-02-09 ENCOUNTER — Telehealth: Payer: Self-pay | Admitting: Internal Medicine

## 2020-02-09 ENCOUNTER — Ambulatory Visit: Payer: BLUE CROSS/BLUE SHIELD | Admitting: Internal Medicine

## 2020-02-09 ENCOUNTER — Ambulatory Visit: Payer: BLUE CROSS/BLUE SHIELD | Admitting: Psychiatry

## 2020-02-09 VITALS — BP 118/68 | HR 68 | Temp 96.6°F | Wt 200.3 lb

## 2020-02-09 DIAGNOSIS — F329 Major depressive disorder, single episode, unspecified: Secondary | ICD-10-CM

## 2020-02-09 DIAGNOSIS — F32A Depression, unspecified: Secondary | ICD-10-CM

## 2020-02-09 DIAGNOSIS — G4700 Insomnia, unspecified: Secondary | ICD-10-CM

## 2020-02-09 DIAGNOSIS — F411 Generalized anxiety disorder: Secondary | ICD-10-CM

## 2020-02-09 DIAGNOSIS — F332 Major depressive disorder, recurrent severe without psychotic features: Secondary | ICD-10-CM

## 2020-02-09 DIAGNOSIS — E119 Type 2 diabetes mellitus without complications: Secondary | ICD-10-CM

## 2020-02-09 DIAGNOSIS — F419 Anxiety disorder, unspecified: Secondary | ICD-10-CM

## 2020-02-09 MED ORDER — BUPROPION HCL 300 MG PO TB24 *I*
300.0000 mg | ORAL_TABLET | Freq: Every morning | ORAL | 5 refills | Status: DC
Start: 2020-02-09 — End: 2020-07-16

## 2020-02-09 MED ORDER — HYDROXYZINE HCL 25 MG PO TABS *I*
12.5000 mg | ORAL_TABLET | Freq: Every evening | ORAL | 3 refills | Status: AC
Start: 2020-02-09 — End: ?

## 2020-02-09 NOTE — Progress Notes (Signed)
[facility] Nurse Practitioner Psychopharmacology Follow-up Note     Length of Session: 30 minutes.    Diagnosis Addressed    ICD-10-CM ICD-9-CM   1. Major depressive disorder, recurrent episode, severe with anxious distress  F33.2 296.33   2. GAD (generalized anxiety disorder)  F41.1 300.02       Patient Active Problem List   Diagnosis Code    Irritable Bowel Syndrome K58.9    Nephrolithiasis N20.0    Hyperlipidemia E78.5    Diabetes mellitus E11.9    Recurrent acute sinusitis J01.91    Anxiety F41.9    Elevated transaminase level R74.01         Recent History and Response to Medications  Met with pt for Behavioral Health follow up. Patient states he is having good days and bad days. Still struggling with over-reacting to unexpected things, feels his level of frustration with his job has increased. He is starting a medical leave for 6 weeks on Monday, trying to accept that "the world will still go on without me." States he had a supportive conversation with his colleagues with it, it was difficult for him but he was glad it was received well.    Still having some issues with sleeping, waking up too early every day. Feels still tired like he needs a couple more hours of sleep. Once he is up and moving he is okay.     He states yesterday was a bad day, learned about something upsetting at work, he felt "paralyzed", "regressed with some of my thought patterns," had some suicidal thoughts on the way home from work. He does feel he is moving forward today, feeling better. He felt discouraged that he had a setback because before that time he had not had thoughts like that in awhile.     He has been able to enjoy things a little more "once I'm in the moment." Found himself more interested in the final lacrosse game of the season. He is hoping to take the time away from work to relax as well as to work with his therapist, perhaps find some workbooks or other resources to do some  intensive work on his mental health and management of symptoms.      Current use of alcohol or drugs: 1-2 drinks and cannabis in the evening      ENERGY: Good, "once I'm up and showered"  CONCENTRATION: fair , had some difficulty yesterday due to getting bad news at work, found he could focus better once everybody left  SLEEP: Insomnia:  Terminal.    WEIGHT: No Change  SEXUAL FUNCTION: not asked  ENJOYMENT/INTEREST: good     Current Medications  Current Outpatient Medications   Medication Sig    VICTOZA 18 MG/3ML injection pen INJECT 1.8MG SUBCUTANEOUSLY (UNDER THE SKIN) EVERY DAY    hydrOXYzine HCl (ATARAX) 25 MG tablet Take 0.5-1 tablets (12.5-25 mg total) by mouth at bedtime    citalopram (CELEXA) 40 MG tablet Take 1 tablet (40 mg total) by mouth daily    buPROPion (WELLBUTRIN XL) 150 MG 24 hr tablet Take 1 tablet (150 mg total) by mouth every morning Swallow whole. Do not crush, break, or chew.    metFORMIN (FORTAMET) 1000 MG (OSM) 24 hr tablet Take 2 tablets (2,000 mg total) by mouth daily (with dinner) In addition to 500 mg dose for total of 2500 mg daily. Swallow whole. Do not cut, crush, or chew.    metFORMIN (FORTAMET) 500 MG (OSM)  24 hr tablet Take 1 tablet (500 mg total) by mouth daily (with dinner) Take in addition to 2000 mg Dose for total of 2500 mg daily. Swallow whole. Do not cut, crush, or chew.    insulin pen needle (B-D UF III MINI PEN NEEDLES) 31G X 5 MM USE AS DIRECTED    atorvastatin (LIPITOR) 20 MG tablet TAKE 1 TABLET BY MOUTH EVERY DAY    canagliflozin (INVOKANA) 300 MG tablet Take 1 tablet (300 mg total) by mouth daily (before breakfast)    FLUCELVAX QUADRIVALENT 0.5 ML syringe TO BE GIVEN BY PHARMACIST PER STANDING ORDER    alcohol swabs pads Use as directed    BAYER CONTOUR test strip tes 2-3 times daily as needed for 250.02    LANCETS ULTRA THIN MISC Test 3 times daily for 250.02     No current facility-administered medications for this visit.         Side Effects  Other:  Sweating on his brow    Mental Status  APPEARANCE: Appears stated age, Well-groomed  ATTITUDE TOWARD INTERVIEWER: Cooperative  MOTOR ACTIVITY: WNL (within normal limits)  EYE CONTACT: Direct  SPEECH: Normal rate and tone  AFFECT: Full Range  MOOD: Euthymic  THOUGHT PROCESS: Normal  THOUGHT CONTENT: No unusual themes  PERCEPTION: No evidence of hallucinations  ORIENTATION: Alert and Oriented X 3.  CONCENTRATION: WNL  MEMORY:   Recent: intact   Remote: intact  COGNITIVE FUNCTION: Average intelligence  JUDGMENT: Intact  IMPULSE CONTROL: Intact  INSIGHT: Good    Risk Assessment  Self Injury: Patient Denies  Suicidal Ideation: He experiences fleeting suicidal thoughts when dysregulated, denies plan or intent, he imagines stabbing himself or having a purposeful car accident, imagines he wouldn't want to kill himself this way but "just seriously hurt myself to escape." He called his wife when feeling this way yesterday.  Homicidal Ideation: Patient Denies  Aggressive Behavior: Patient Denies      Results  None for this session    Assessment  FORMULATION:   Pt is demonstrating overall improvement in symptoms though continues to have bouts of acutely depressed mood and anxiety, usually triggered by stress. Taking a medical leave from work this summer which will give him a good opportunity for stress reduction, recharging and self-care, as well as to work more with his therapist on strategies to manage his symptoms. Could benefit particularly from stress management and emotional regulation skills. He does feel he may benefit from optimizing the dose of bupropion which is reasonable at this time. He is tolerating both citalopram and bupropion well so far. Will plan to meet at least once more; if symptoms continue to move in the right direction will likely defer back to Dr. Lianne Bushy for medication management and I can be available as needed in the future.     Recommendations/Plan and Rationale  PLAN:   - Increase bupropion XL to  367m daily  - Continue citalopram 463mdaily  - Agree with adding hydroxyzine in evening- prescribed by Dr. MaLianne Bushy

## 2020-02-09 NOTE — Progress Notes (Signed)
[facility] Follow-up Note     ReasonReason For Visit:   Follow up     HPI:      Dale Schultz is 44 y.o. year old male coming in to discuss the following issues:    Wife, [name], on zoom and telephone.     Anxiety/depression- met with Sherlyn Hay, overall he has been noticing more good days than bad days recently. His medications were not adjusted at that visit. He continues on Celexa 40 mg and Wellbutrin 150 mg. They note that the Celexa has been very helpful for his mood. His wife would like him to see psychiatrist as well. He notes that he is still interested in taking leave June 21-Aug 2nd, needs his short term disability paperwork filled out.     He notes that he is still having some issues with sweating that have been going on for some time. He has seen endo for this before as well, and his thyroid function was normal.     His sleep has been "terrible." he is able to go to bed ok, but then he wakes up in the morning around 4 am. He is waking up around 5 in the morning to urinate each night. He drinks more water when he is home and will drink up to 10 pm at night.     Physical Exam:     Vitals:    02/09/20 1138   BP: 118/68   Pulse: 68   Temp: 35.9 C (96.6 F)   TempSrc: Temporal   Weight: 90.9 kg (200 lb 4.8 oz)       Vitals Signs: OZD:GUYQ mass index is 28.74 kg/m., otherwise as above, reviewed with patient    Psych:   Appearance:  age appropriate   Behavior:  normal   Speech:  normal pitch and normal volume   Mood:  Depressed   Affect:  mood-congruent   Thought Process:  Normal   Thought Content:  normal   Sensorium:  person, place, time/date and situation   Cognition:  grossly intact   Insight:  Age appropriate   Judgment:  Age appropriate           Assessment and Plan:     Diagnoses and all orders for this visit:    Depression, unspecified depression type/Anxiety- overall sxs are stabilizing. Continue Celexa 40 mg, will increase Wellbutrin to 300 mg. Added atarax for sleep.   -      hydrOXYzine HCl (ATARAX) 25 MG tablet; Take 0.5-1 tablets (12.5-25 mg total) by mouth at bedtime    Type 2 diabetes mellitus without complication, without long-term current use of insulin  - A1c above goal, working on current psychiatric illness.     Insomnia, unspecified type  -     hydrOXYzine HCl (ATARAX) 25 MG tablet; Take 0.5-1 tablets (12.5-25 mg total) by mouth at bedtime        Handouts Given: Patient educational materials distributed by print-out and/or inserted into AVS   Follow-up:   RTC:  1 month        Author:   Chinita Greenland, MD   General Medicine Division  [facility] [phone]  02/09/2020 11:42 AM            Additional Objective Data Listed Below.    Review of Systems:     12 point ROS negative except for HPI above.     Medications/Allergies/Immunizations:     Medication list reviewed and reconciled this visit in the EMR.  Allergy list confirmed in the EMR.    Current Outpatient Medications   Medication Sig    VICTOZA 18 MG/3ML injection pen INJECT 1.8MG SUBCUTANEOUSLY (UNDER THE SKIN) EVERY DAY    citalopram (CELEXA) 40 MG tablet Take 1 tablet (40 mg total) by mouth daily    buPROPion (WELLBUTRIN XL) 150 MG 24 hr tablet Take 1 tablet (150 mg total) by mouth every morning Swallow whole. Do not crush, break, or chew.    metFORMIN (FORTAMET) 1000 MG (OSM) 24 hr tablet Take 2 tablets (2,000 mg total) by mouth daily (with dinner) In addition to 500 mg dose for total of 2500 mg daily. Swallow whole. Do not cut, crush, or chew.    metFORMIN (FORTAMET) 500 MG (OSM) 24 hr tablet Take 1 tablet (500 mg total) by mouth daily (with dinner) Take in addition to 2000 mg Dose for total of 2500 mg daily. Swallow whole. Do not cut, crush, or chew.    insulin pen needle (B-D UF III MINI PEN NEEDLES) 31G X 5 MM USE AS DIRECTED    atorvastatin (LIPITOR) 20 MG tablet TAKE 1 TABLET BY MOUTH EVERY DAY    canagliflozin (INVOKANA) 300 MG tablet Take 1 tablet (300 mg total) by mouth daily (before breakfast)    FLUCELVAX  QUADRIVALENT 0.5 ML syringe TO BE GIVEN BY PHARMACIST PER STANDING ORDER    alcohol swabs pads Use as directed    BAYER CONTOUR test strip tes 2-3 times daily as needed for 250.02    LANCETS ULTRA THIN MISC Test 3 times daily for 250.02         Past Medical History/Family History/Social History:     Past Medical History:   Diagnosis Date    Anxiety     Diabetes mellitus     HLD (hyperlipidemia)        Past Medical History, Family History, and Social History reviewed, confirmed, and updated as appropriate this visit in the EMR.     Labs and Imaging:   Labs and Imaging results reviewed with patient.

## 2020-02-09 NOTE — Telephone Encounter (Signed)
Writer called patient in regards to paperwork for FMLA has been completed     If patient wants [place] to fax paperwork he has to sign a Medical Release Form or he can pick up paperwork     Sent mychart message

## 2020-02-11 ENCOUNTER — Encounter: Payer: Self-pay | Admitting: Internal Medicine

## 2020-02-16 NOTE — Telephone Encounter (Signed)
Writer called patient and advised Clinical research associate will refax again ,but Clinical research associate did fax on the 02/15/20     Patient stated thank you

## 2020-02-16 NOTE — Telephone Encounter (Signed)
Copied from CRM [id number]. Topic: Access to Care - Speak to Provider/Office Staff  >> Feb 16, 2020  2:41 PM Waldo Laine wrote:  Patient requesting to speak with [facility].    Patient received call from employer - [employer]  - was advised they have not received his FMLA paperwork back yet.    Can you fax it over to them as soon as possible.  Fax number should be on the paperwork that was completed 6/17    Patient can be reached [phone]

## 2020-03-01 ENCOUNTER — Ambulatory Visit: Payer: BLUE CROSS/BLUE SHIELD | Admitting: Psychiatry

## 2020-03-01 ENCOUNTER — Encounter: Payer: Self-pay | Admitting: Psychiatry

## 2020-03-01 VITALS — BP 140/80 | HR 80 | Temp 98.4°F | Ht 70.0 in | Wt 194.9 lb

## 2020-03-01 DIAGNOSIS — F411 Generalized anxiety disorder: Secondary | ICD-10-CM

## 2020-03-01 DIAGNOSIS — F332 Major depressive disorder, recurrent severe without psychotic features: Secondary | ICD-10-CM

## 2020-03-01 NOTE — Progress Notes (Signed)
[facility] Nurse Practitioner Psychopharmacology Follow-up Note     Length of Session: 30 minutes.    Diagnosis Addressed    ICD-10-CM ICD-9-CM   1. Major depressive disorder, recurrent episode, severe with anxious distress  F33.2 296.33   2. GAD (generalized anxiety disorder)  F41.1 300.02       Patient Active Problem List   Diagnosis Code    Irritable Bowel Syndrome K58.9    Nephrolithiasis N20.0    Hyperlipidemia E78.5    Diabetes mellitus E11.9    Recurrent acute sinusitis J01.91    Anxiety F41.9    Elevated transaminase level R74.01         Recent History and Response to Medications  Met with pt for Behavioral Health follow up. He has been on his leave of absence from work and it has been a relief not to have to think about it. He has taken his email off his phone which "feels weird," had been constantly checking it previously. Had another job opportunity that didn't pan out. He feels that he is coping with it okay, trying to accept "it is what it is".     He is putting his dog down tomorrow, they have had him for 16 years, feeling sad about this. He feels he has been more emotional overall, has been "stoic" in the past and now feels he has been feeling more of a range of feelings.     He has been sleeping better, hasn't needed the hydroxyzine. He still tends to wake up early, sometimes this is to go to the bathroom, though he is able to get back to sleep.     He went biking for the first time in awhile. Has been reading a book, which has been awhile.     No recent suicidal ideation. He feels good overall. He has not been feeling as "woe is me". There is some anxiety about returning to work but thinks he has been changing his mindset about it. He is realizing "the place will still be standing if I'm still there." Feels when he returns he will think more about setting boundaries and saying no to things.         Current use of alcohol or drugs: Small amount of marijuana  and 1-2 drinks of alcohol in the evening      ENERGY: Good  CONCENTRATION: good   SLEEP: Still with early morning waking, but can fall back asleep  APPETITE: Good     WEIGHT: Loss. 5 lbs  SEXUAL FUNCTION: not asked  ENJOYMENT/INTEREST: good     Current Medications  Current Outpatient Medications   Medication Sig    VICTOZA 18 MG/3ML injection pen INJECT 1.8MG SUBCUTANEOUSLY (UNDER THE SKIN) EVERY DAY    hydrOXYzine HCl (ATARAX) 25 MG tablet Take 0.5-1 tablets (12.5-25 mg total) by mouth at bedtime    buPROPion (WELLBUTRIN XL) 300 MG 24 hr tablet Take 1 tablet (300 mg total) by mouth every morning Swallow whole. Do not crush, break, or chew.    citalopram (CELEXA) 40 MG tablet Take 1 tablet (40 mg total) by mouth daily    metFORMIN (FORTAMET) 1000 MG (OSM) 24 hr tablet Take 2 tablets (2,000 mg total) by mouth daily (with dinner) In addition to 500 mg dose for total of 2500 mg daily. Swallow whole. Do not cut, crush, or chew.    metFORMIN (FORTAMET) 500 MG (OSM) 24 hr tablet Take 1 tablet (500 mg total) by mouth daily (  with dinner) Take in addition to 2000 mg Dose for total of 2500 mg daily. Swallow whole. Do not cut, crush, or chew.    insulin pen needle (B-D UF III MINI PEN NEEDLES) 31G X 5 MM USE AS DIRECTED    atorvastatin (LIPITOR) 20 MG tablet TAKE 1 TABLET BY MOUTH EVERY DAY    canagliflozin (INVOKANA) 300 MG tablet Take 1 tablet (300 mg total) by mouth daily (before breakfast)    FLUCELVAX QUADRIVALENT 0.5 ML syringe TO BE GIVEN BY PHARMACIST PER STANDING ORDER    alcohol swabs pads Use as directed    BAYER CONTOUR test strip tes 2-3 times daily as needed for 250.02    LANCETS ULTRA THIN MISC Test 3 times daily for 250.02     No current facility-administered medications for this visit.         Side Effects  Other: Sweating    Mental Status  APPEARANCE: Appears stated age, Well-groomed  ATTITUDE TOWARD INTERVIEWER: Cooperative  MOTOR ACTIVITY: WNL (within normal limits)  EYE CONTACT:  Direct  SPEECH: Normal rate and tone  AFFECT: Full Range  MOOD: Euthymic  THOUGHT PROCESS: Normal  THOUGHT CONTENT: No unusual themes  PERCEPTION: No evidence of hallucinations  ORIENTATION: Alert and Oriented X 3.  CONCENTRATION: WNL  MEMORY:   Recent: intact   Remote: intact  COGNITIVE FUNCTION: Above Average intelligence  JUDGMENT: Intact  IMPULSE CONTROL: Intact  INSIGHT: Good    Risk Assessment  Self Injury: Patient Denies  Suicidal Ideation: Patient Denies  Homicidal Ideation: Patient Denies  Aggressive Behavior: Patient Denies      Results  Recent Review Flowsheet Data     PHQ Scores 03/01/2020 01/25/2020 01/11/2020 12/28/2019 10/09/2017    PSQ2 Q1 - Interest/Pleasure - Y Y - -    PSQ2 Q2 - Down, Depressed, Hopeless - Y Y Y -    PHQ Q9 - Better Off Dead 0 0 1 2 0    PHQ Calculated Score '3 13 24 26 4        ' GAD-7 Score: 2    Assessment  FORMULATION:   Pt demonstrating marked improvement in symptoms, despite some recent stressors and disappointments, and the impending loss of his dog. Pt has benefited from the medical leave from work and has been taking the time for self-care and reflection. Feels some expectable anxiety about returning but  has some new coping strategies and outlook for when he returns. Pt tolerating medications well and PHQ-9 and GAD-7 scores have significantly improved. Agreed to allow his primary care provider to take over with medication management from here but I can be available if any changes are needed again in the future.     Recommendations/Plan and Rationale  PLAN:   - Continue citalopram 71m daily  - Continue Wellbutrin XL 3080mdaily  - Continue therapy with [facility]- Follow up with Dr. MaLianne BushyPCP)  - I can be available to consult again in the future if needed

## 2020-03-16 ENCOUNTER — Ambulatory Visit: Payer: BLUE CROSS/BLUE SHIELD | Admitting: Internal Medicine

## 2020-03-16 ENCOUNTER — Encounter: Payer: Self-pay | Admitting: Internal Medicine

## 2020-03-16 VITALS — BP 128/64 | HR 76 | Temp 95.7°F | Wt 195.9 lb

## 2020-03-16 DIAGNOSIS — F329 Major depressive disorder, single episode, unspecified: Secondary | ICD-10-CM

## 2020-03-16 DIAGNOSIS — F419 Anxiety disorder, unspecified: Secondary | ICD-10-CM

## 2020-03-16 DIAGNOSIS — E119 Type 2 diabetes mellitus without complications: Secondary | ICD-10-CM

## 2020-03-16 DIAGNOSIS — I10 Essential (primary) hypertension: Secondary | ICD-10-CM

## 2020-03-16 DIAGNOSIS — F32A Depression, unspecified: Secondary | ICD-10-CM

## 2020-03-16 NOTE — Progress Notes (Signed)
[facility] Follow-up Note     ReasonReason For Visit:   Follow up    HPI:      Dale Schultz is 44 y.o. year old male coming in to discuss the following issues:    Has been following with [name] and his therapist, Raynelle Fanning. Has been on leave from work and feels this has been good. He continues on Celexa 40 mg, Wellbutrin 300 mg, and overall things have been stable. He has been sleeping well now, and things have improved. He is a little nervous about going to back to work. He is meeting with Raynelle Fanning on a regular basis as well. Overall his time away from work has helped him with his mental well being and reset his perception on work-life balance.     Type 2 DM- A1c has been above goal in the setting of worsening mental health. He is working on some lifestyle changes. Continues on Victoza 1.8 mg, metformin 2500 mg daily, Invokana 300 mg daily. On statin. He is now starting to exercise again.     Physical Exam:     Vitals:    03/16/20 1041   BP: 128/64   Pulse: 76   Temp: 35.4 C (95.7 F)   TempSrc: Temporal   Weight: 88.9 kg (195 lb 14.4 oz)       Vitals Signs: CHY:IFOY mass index is 28.11 kg/m., otherwise as above, reviewed with patient    General: appears well, NAD   Psych: Mood and affect WNL.       Assessment and Plan:     Diagnoses and all orders for this visit:    Anxiety/Depression, unspecified depression type  - overall his mood has been stable, and improved  - continue Celexa 40 mg and Wellbutrin 300 mg daily.   - follow with [name].     Type 2 diabetes mellitus without complication, without long-term current use of insulin- last A1c above goal, continue current metformin, Victoza. And Invokana. Can consider change to Trulicity 1.5 mg with plan to up titrate to higher dose.  -     Hemoglobin A1c; Future  -     Lipid Panel (Reflex to Direct  LDL if Triglycerides more than 400); Future    Essential hypertension- bp at goal    -     Comprehensive metabolic panel;  Future        Handouts Given: Patient educational materials distributed by print-out and/or inserted into AVS   Follow-up:   RTC:  3 months for follow up        Author:   Raiford Noble, MD   General Medicine Division  [facility] [phone]  03/16/2020 8:16 AM            Additional Objective Data Listed Below.    Review of Systems:     12 point ROS negative except for HPI above.     Medications/Allergies/Immunizations:     Medication list reviewed and reconciled this visit in the EMR. Allergy list confirmed in the EMR.    Current Outpatient Medications   Medication Sig    VICTOZA 18 MG/3ML injection pen INJECT 1.8MG  SUBCUTANEOUSLY (UNDER THE SKIN) EVERY DAY    hydrOXYzine HCl (ATARAX) 25 MG tablet Take 0.5-1 tablets (12.5-25 mg total) by mouth at bedtime    buPROPion (WELLBUTRIN XL) 300 MG 24 hr tablet Take 1 tablet (300 mg total) by mouth every morning Swallow whole. Do not crush, break, or chew.    citalopram (CELEXA)  40 MG tablet Take 1 tablet (40 mg total) by mouth daily    metFORMIN (FORTAMET) 1000 MG (OSM) 24 hr tablet Take 2 tablets (2,000 mg total) by mouth daily (with dinner) In addition to 500 mg dose for total of 2500 mg daily. Swallow whole. Do not cut, crush, or chew.    metFORMIN (FORTAMET) 500 MG (OSM) 24 hr tablet Take 1 tablet (500 mg total) by mouth daily (with dinner) Take in addition to 2000 mg Dose for total of 2500 mg daily. Swallow whole. Do not cut, crush, or chew.    insulin pen needle (B-D UF III MINI PEN NEEDLES) 31G X 5 MM USE AS DIRECTED    atorvastatin (LIPITOR) 20 MG tablet TAKE 1 TABLET BY MOUTH EVERY DAY    canagliflozin (INVOKANA) 300 MG tablet Take 1 tablet (300 mg total) by mouth daily (before breakfast)    FLUCELVAX QUADRIVALENT 0.5 ML syringe TO BE GIVEN BY PHARMACIST PER STANDING ORDER    alcohol swabs pads Use as directed    BAYER CONTOUR test strip tes 2-3 times daily as needed for 250.02    LANCETS ULTRA THIN MISC Test 3 times daily for 250.02         Past Medical  History/Family History/Social History:     Past Medical History:   Diagnosis Date    Anxiety     Diabetes mellitus     HLD (hyperlipidemia)        Past Medical History, Family History, and Social History reviewed, confirmed, and updated as appropriate this visit in the EMR.     Labs and Imaging:   Labs and Imaging results reviewed with patient.

## 2020-04-16 ENCOUNTER — Other Ambulatory Visit: Payer: Self-pay | Admitting: Internal Medicine

## 2020-04-16 DIAGNOSIS — E119 Type 2 diabetes mellitus without complications: Secondary | ICD-10-CM

## 2020-04-16 NOTE — Telephone Encounter (Signed)
Medication request routed to Dr Glenetta Hew for processing 04/16/2020 9:15 AM

## 2020-05-19 ENCOUNTER — Other Ambulatory Visit: Payer: Self-pay | Admitting: Internal Medicine

## 2020-05-19 DIAGNOSIS — E119 Type 2 diabetes mellitus without complications: Secondary | ICD-10-CM

## 2020-05-21 NOTE — Telephone Encounter (Signed)
Medication request routed to Dr Al Corpus for processing 05/21/2020 9:50 AM

## 2020-06-14 ENCOUNTER — Other Ambulatory Visit
Admission: RE | Admit: 2020-06-14 | Discharge: 2020-06-14 | Disposition: A | Discharge status: Home / Self Care | Payer: BLUE CROSS/BLUE SHIELD | Source: Ambulatory Visit | Attending: Internal Medicine | Admitting: Internal Medicine

## 2020-06-14 DIAGNOSIS — E119 Type 2 diabetes mellitus without complications: Secondary | ICD-10-CM

## 2020-06-14 DIAGNOSIS — E785 Hyperlipidemia, unspecified: Secondary | ICD-10-CM

## 2020-06-14 DIAGNOSIS — I10 Essential (primary) hypertension: Secondary | ICD-10-CM

## 2020-06-14 LAB — COMPREHENSIVE METABOLIC PANEL
ALT: 43 U/L (ref 0–50)
AST: 33 U/L (ref 0–50)
Albumin: 5 g/dL (ref 3.5–5.2)
Alk Phos: 43 U/L (ref 40–130)
Anion Gap: 16 (ref 7–16)
Bilirubin,Total: 0.5 mg/dL (ref 0.0–1.2)
CO2: 25 mmol/L (ref 20–28)
Calcium: 9.9 mg/dL (ref 8.6–10.2)
Chloride: 100 mmol/L (ref 96–108)
Creatinine: 0.85 mg/dL (ref 0.67–1.17)
GFR,Black: 121 *
GFR,Caucasian: 104 *
Glucose: 132 mg/dL — ABNORMAL HIGH (ref 60–99)
Lab: 15 mg/dL (ref 6–20)
Potassium: 4.3 mmol/L (ref 3.3–5.1)
Sodium: 141 mmol/L (ref 133–145)
Total Protein: 7.3 g/dL (ref 6.3–7.7)

## 2020-06-14 LAB — LIPID PANEL
Chol/HDL Ratio: 3.8
Cholesterol: 154 mg/dL
HDL: 41 mg/dL (ref 40–60)
LDL Calculated: 61 mg/dL
Non HDL Cholesterol: 113 mg/dL
Triglycerides: 262 mg/dL — AB

## 2020-06-15 ENCOUNTER — Ambulatory Visit: Payer: BLUE CROSS/BLUE SHIELD | Admitting: Internal Medicine

## 2020-06-15 ENCOUNTER — Encounter: Payer: Self-pay | Admitting: Internal Medicine

## 2020-06-15 VITALS — BP 131/75 | HR 86 | Temp 96.6°F | Ht 70.0 in | Wt 201.2 lb

## 2020-06-15 DIAGNOSIS — E785 Hyperlipidemia, unspecified: Secondary | ICD-10-CM

## 2020-06-15 DIAGNOSIS — F419 Anxiety disorder, unspecified: Secondary | ICD-10-CM

## 2020-06-15 DIAGNOSIS — E119 Type 2 diabetes mellitus without complications: Secondary | ICD-10-CM

## 2020-06-15 DIAGNOSIS — Z1211 Encounter for screening for malignant neoplasm of colon: Secondary | ICD-10-CM

## 2020-06-15 DIAGNOSIS — M778 Other enthesopathies, not elsewhere classified: Secondary | ICD-10-CM

## 2020-06-15 DIAGNOSIS — F32A Depression, unspecified: Secondary | ICD-10-CM

## 2020-06-15 LAB — HEMOGLOBIN A1C: Hemoglobin A1C: 9.6 % — ABNORMAL HIGH

## 2020-06-15 MED ORDER — DULAGLUTIDE 1.5 MG/0.5ML SC SOAJ *I*
1.5000 mg | SUBCUTANEOUS | 3 refills | Status: DC
Start: 2020-06-15 — End: 2020-09-14

## 2020-06-15 NOTE — Progress Notes (Signed)
[facility] Follow-up Note     ReasonReason For Visit:   Follow up    HPI:      Dale Schultz is 44 y.o. year old male coming in to discuss the following issues:    T2DM- still waiting on most recent A1c. He continues on Victoza 1.8 mg, metformin 2500 mg daily, atorvastatin 20 mg and Invokana 300 mg. He is unsure where is BG has been. He has been stress eating at night mostly.     Anxiety/depression- he continues on Celexa 40 mg and Wellbutrin 300 mg daily. Following with [name] still. He is back to work since August 2nd. 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 Very busy outside of work as well.     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    He is wondering about COVID booster     Physical Exam:     Vitals:    06/15/20 0924   BP: 131/75   BP Location: Left arm   Patient Position: Sitting   Cuff Size: large adult   Pulse: 86   Temp: 35.9 C (96.6 F)   TempSrc: Temporal   Weight: 91.3 kg (201 lb 3.2 oz)   Height: 1.778 m (5\' 10" )       Vitals Signs: mass index is 28.87 kg/m., otherwise as above, reviewed with patient    General: appears well, NAD  Neuro: no focal deficits   Psych: mood and affect wnl      Assessment and Plan:     Diagnoses and all orders for this visit:    Type 2 diabetes mellitus without complication, without long-term current use of insulin- his A1c has increased again despite metformin, Invokana, and Victoza. Will plan to switch to Trulicity 1.5 mg and then up titrate to 3 mg after 2 weeks. May need to consider addition of nightly Lantus 10 units, but will optimize trulicity.  -     dulaglutide (TRULICITY) 1.5 MG/0.5ML pen; Inject 0.5 mLs (1.5 mg total) into the skin every 7 days    Hyperlipidemia, unspecified hyperlipidemia type  - continue Lipitor    AnxietyDepression, unspecified depression type  - encouraged continued follow up with BH  - continue Celexa, and Wellbutrin    Colon cancer screening  -      AMB REFERRAL TO GASTROENTEROLOGY    Right wrist tendonitis  - rest, wrist brace and NSAIDs for inflammation.       Handouts Given: Patient educational materials distributed by print-out and/or inserted into AVS   Follow-up:   RTC:  3 months for DM follow up         Author:   BJY:NWGN, MD   General Medicine Division  [facility] [phone]  06/15/2020 8:06 AM            Additional Objective Data Listed Below.    Review of Systems:     12 point ROS negative except for HPI above.     Medications/Allergies/Immunizations:     Medication list reviewed and reconciled this visit in the EMR. Allergy list confirmed in the EMR.    Current Outpatient Medications   Medication Sig    VICTOZA 18 MG/3ML injection pen INJECT 1.8MG  SUBCUTANEOUSLY (UNDER THE SKIN) EVERY DAY    INVOKANA 300 MG tablet TAKE 1 TABLET BY MOUTH EVERY DAY BEFORE BREAKFAST    hydrOXYzine HCl (ATARAX) 25 MG tablet Take 0.5-1  tablets (12.5-25 mg total) by mouth at bedtime    buPROPion (WELLBUTRIN XL) 300 MG 24 hr tablet Take 1 tablet (300 mg total) by mouth every morning Swallow whole. Do not crush, break, or chew.    citalopram (CELEXA) 40 MG tablet Take 1 tablet (40 mg total) by mouth daily    metFORMIN (FORTAMET) 1000 MG (OSM) 24 hr tablet Take 2 tablets (2,000 mg total) by mouth daily (with dinner) In addition to 500 mg dose for total of 2500 mg daily. Swallow whole. Do not cut, crush, or chew.    metFORMIN (FORTAMET) 500 MG (OSM) 24 hr tablet Take 1 tablet (500 mg total) by mouth daily (with dinner) Take in addition to 2000 mg Dose for total of 2500 mg daily. Swallow whole. Do not cut, crush, or chew.    insulin pen needle (B-D UF III MINI PEN NEEDLES) 31G X 5 MM USE AS DIRECTED    atorvastatin (LIPITOR) 20 MG tablet TAKE 1 TABLET BY MOUTH EVERY DAY    FLUCELVAX QUADRIVALENT 0.5 ML syringe TO BE GIVEN BY PHARMACIST PER STANDING ORDER    alcohol swabs pads Use as directed    BAYER CONTOUR test strip tes 2-3 times daily as needed for 250.02    LANCETS  ULTRA THIN MISC Test 3 times daily for 250.02         Past Medical History/Family History/Social History:     Past Medical History:   Diagnosis Date    Anxiety     Diabetes mellitus     HLD (hyperlipidemia)        Past Medical History, Family History, and Social History reviewed, confirmed, and updated as appropriate this visit in the EMR.     Labs and Imaging:   Labs and Imaging results reviewed with patient.

## 2020-06-15 NOTE — Patient Instructions (Signed)
Call for colonoscopy- [phone]

## 2020-06-18 ENCOUNTER — Telehealth: Payer: Self-pay | Admitting: Optometry

## 2020-06-18 NOTE — Telephone Encounter (Signed)
06/18/2020    I wanted you to be aware that the following patient has cancelled their appointment.    Provider Name: Dr. Linward Headland   Date of Appointment: 06/19/20   Patient Name: Dale Schultz   MRN: [id number]   DOB: 03-17-1976       Reason for Cancellation:  Appointment conflict.        Thank you,  Margarita Grizzle

## 2020-06-19 ENCOUNTER — Ambulatory Visit: Payer: BLUE CROSS/BLUE SHIELD | Admitting: Optometry

## 2020-06-22 ENCOUNTER — Ambulatory Visit: Payer: BLUE CROSS/BLUE SHIELD | Admitting: Optometry

## 2020-06-22 DIAGNOSIS — E119 Type 2 diabetes mellitus without complications: Secondary | ICD-10-CM

## 2020-06-22 DIAGNOSIS — H26492 Other secondary cataract, left eye: Secondary | ICD-10-CM

## 2020-06-22 NOTE — Progress Notes (Signed)
Assessment:  1. Type 2 diabetes mellitus without retinopathy  - Onset x 2005  - Elevated A1c: 9.6 (05/2020)  - Controlled with insulin and orals    2. PCO (posterior capsular opacification), left  - Not visually significant    Plan:  1. Communication sent to PCP. Pt ed regarding importance of tight blood sugar control and the possible ocular consequences of uncontrolled diabetes. RTC 1 year for DM check.  2. Patient ed to RTC should symptoms develop sooner.

## 2020-07-05 ENCOUNTER — Other Ambulatory Visit: Payer: Self-pay | Admitting: Internal Medicine

## 2020-07-05 ENCOUNTER — Other Ambulatory Visit: Payer: Self-pay | Admitting: Psychiatry

## 2020-07-05 DIAGNOSIS — F332 Major depressive disorder, recurrent severe without psychotic features: Secondary | ICD-10-CM

## 2020-07-05 DIAGNOSIS — E119 Type 2 diabetes mellitus without complications: Secondary | ICD-10-CM

## 2020-07-05 DIAGNOSIS — F411 Generalized anxiety disorder: Secondary | ICD-10-CM

## 2020-07-05 NOTE — Telephone Encounter (Signed)
Medication request routed to Dr Al Corpus for processing 07/05/2020 8:54 AM

## 2020-07-05 NOTE — Telephone Encounter (Signed)
Medication request routed to Dr Al Corpus for processing 07/05/2020 8:56 AM

## 2020-07-05 NOTE — Telephone Encounter (Signed)
Medication request routed to Dr Al Corpus for processing 07/05/2020 8:55 AM

## 2020-07-14 ENCOUNTER — Other Ambulatory Visit: Payer: Self-pay | Admitting: Psychiatry

## 2020-07-14 DIAGNOSIS — F32A Depression, unspecified: Secondary | ICD-10-CM

## 2020-07-16 NOTE — Telephone Encounter (Signed)
Medication request routed to Dr Mahler for processing 07/16/2020 9:36 AM

## 2020-09-14 ENCOUNTER — Ambulatory Visit: Payer: BLUE CROSS/BLUE SHIELD | Attending: Internal Medicine | Admitting: Internal Medicine

## 2020-09-14 ENCOUNTER — Encounter: Payer: Self-pay | Admitting: Internal Medicine

## 2020-09-14 VITALS — BP 142/75 | HR 83 | Temp 96.6°F | Wt 204.6 lb

## 2020-09-14 DIAGNOSIS — F419 Anxiety disorder, unspecified: Secondary | ICD-10-CM

## 2020-09-14 DIAGNOSIS — E119 Type 2 diabetes mellitus without complications: Secondary | ICD-10-CM

## 2020-09-14 DIAGNOSIS — E785 Hyperlipidemia, unspecified: Secondary | ICD-10-CM | POA: Insufficient documentation

## 2020-09-14 LAB — POCT HEMOGLOBIN A1C: Hemoglobin A1C,POC: 9.3 % — ABNORMAL HIGH

## 2020-09-14 MED ORDER — DULAGLUTIDE 3 MG/0.5ML SC SOAJ *I*
3.0000 mg | SUBCUTANEOUS | 3 refills | Status: DC
Start: 2020-09-14 — End: 2021-08-20

## 2020-09-14 NOTE — Progress Notes (Signed)
[facility] Follow-up Note     ReasonReason For Visit:   Follow up DM    HPI:      Dale Schultz is 45 y.o. year old male coming in to discuss the following issues:    His A1c was elevated last visit, and his Victoza was changed to Trulicity 1.5 mg in addition to his Invokana, metformin. His a1c is still above goal at 9.3, slight improvement.      Anxiety- he continues on Wellbutrin and celexa. Following with BH. His stress has been high since christmas, a lot of COVID cases. He is meeting with his BH every 3 weeks or so.      Referred for colon cancer screening last visit, has not heard back.     Physical Exam:     Vitals:    09/14/20 0917   BP: 142/75   Pulse: 83   Temp: 35.9 C (96.6 F)   TempSrc: Temporal   Weight: 92.8 kg (204 lb 9.6 oz)       Vitals Signs: WJX:BJYN mass index is 29.36 kg/m., otherwise as above, reviewed with patient    General: appears well, NAD  Neuro: no focal deficits  Psych: Mood and affect wnl.       Assessment and Plan:     Diagnoses and all orders for this visit:    Type 2 diabetes mellitus without complication, without long-term current use of insulin- his a1c is still above goal at 9.3, will increase the GLP1. Continue metformin and Invokana. Repeat A1c in 3 months. He is going to work on dietary and exercise changes prior to follow up.   -     dulaglutide (TRULICITY) 3 MG/0.5ML pen; Inject 0.5 mLs (3 mg total) into the skin every 7 days    Hyperlipidemia, unspecified hyperlipidemia type  - continue lipitor     Depression/Anxiety  - continue wellbutrin, and celexa.   - counseled on following with his counselor.     Other orders  -     POCT HEMOGLOBIN A1C        Handouts Given: Patient educational materials distributed by print-out and/or inserted into AVS   Follow-up:   RTC:  3 months for repeat A1c.         Author:   Raiford Noble, MD   General Medicine Division  [facility] [phone]  09/14/2020 8:12 AM            Additional Objective Data Listed Below.    Review of  Systems:     12 point ROS negative except for HPI above.     Medications/Allergies/Immunizations:     Medication list reviewed and reconciled this visit in the EMR. Allergy list confirmed in the EMR.    Current Outpatient Medications   Medication Sig    dulaglutide (TRULICITY) 3 MG/0.5ML pen Inject 0.5 mLs (3 mg total) into the skin every 7 days    buPROPion (WELLBUTRIN XL) 300 mg 24 hr tablet TAKE 1 TABLET BY MOUTH EVERY MORNING SWALLOW WHOLE, DO NOT BREAK, CRUSH OR CHEW    atorvastatin (LIPITOR) 20 mg tablet TAKE 1 TABLET BY MOUTH EVERY DAY    citalopram (CELEXA) 40 mg tablet TAKE 1 TABLET BY MOUTH EVERY DAY    INVOKANA 300 MG tablet TAKE 1 TABLET BY MOUTH EVERY DAY BEFORE BREAKFAST    hydrOXYzine HCl (ATARAX) 25 MG tablet Take 0.5-1 tablets (12.5-25 mg total) by mouth at bedtime    metFORMIN (FORTAMET) 1000 MG (OSM) 24  hr tablet Take 2 tablets (2,000 mg total) by mouth daily (with dinner) In addition to 500 mg dose for total of 2500 mg daily. Swallow whole. Do not cut, crush, or chew.    metFORMIN (FORTAMET) 500 MG (OSM) 24 hr tablet Take 1 tablet (500 mg total) by mouth daily (with dinner) Take in addition to 2000 mg Dose for total of 2500 mg daily. Swallow whole. Do not cut, crush, or chew.    insulin pen needle (B-D UF III MINI PEN NEEDLES) 31G X 5 MM USE AS DIRECTED    FLUCELVAX QUADRIVALENT 0.5 ML syringe TO BE GIVEN BY PHARMACIST PER STANDING ORDER (Patient not taking: Reported on 06/22/2020)    alcohol swabs pads Use as directed (Patient not taking: Reported on 06/22/2020)    BAYER CONTOUR test strip tes 2-3 times daily as needed for 250.02    LANCETS ULTRA THIN MISC Test 3 times daily for 250.02 (Patient not taking: Reported on 06/22/2020)         Past Medical History/Family History/Social History:     Past Medical History:   Diagnosis Date    Anxiety     Diabetes mellitus     HLD (hyperlipidemia)        Past Medical History, Family History, and Social History reviewed, confirmed, and  updated as appropriate this visit in the EMR.     Labs and Imaging:   Labs and Imaging results reviewed with patient.

## 2020-09-14 NOTE — Patient Instructions (Signed)
Call [facility] to schedule colonoscopy- [phone]

## 2020-09-17 ENCOUNTER — Encounter: Payer: Self-pay | Admitting: Internal Medicine

## 2020-09-18 ENCOUNTER — Encounter: Payer: Self-pay | Admitting: Internal Medicine

## 2020-09-21 ENCOUNTER — Other Ambulatory Visit: Payer: Self-pay | Admitting: Internal Medicine

## 2020-09-21 NOTE — Telephone Encounter (Signed)
Medication request routed to Dr Al Corpus for processing 09/21/2020 8:57 AM

## 2020-12-14 ENCOUNTER — Encounter: Payer: Self-pay | Admitting: Internal Medicine

## 2020-12-14 ENCOUNTER — Ambulatory Visit: Payer: BLUE CROSS/BLUE SHIELD | Attending: Internal Medicine | Admitting: Internal Medicine

## 2020-12-14 VITALS — BP 132/82 | HR 80 | Temp 95.7°F | Wt 201.6 lb

## 2020-12-14 DIAGNOSIS — E785 Hyperlipidemia, unspecified: Secondary | ICD-10-CM

## 2020-12-14 DIAGNOSIS — F32A Depression, unspecified: Secondary | ICD-10-CM

## 2020-12-14 DIAGNOSIS — E119 Type 2 diabetes mellitus without complications: Secondary | ICD-10-CM | POA: Insufficient documentation

## 2020-12-14 DIAGNOSIS — F419 Anxiety disorder, unspecified: Secondary | ICD-10-CM | POA: Insufficient documentation

## 2020-12-14 DIAGNOSIS — I10 Essential (primary) hypertension: Secondary | ICD-10-CM | POA: Insufficient documentation

## 2020-12-14 LAB — POCT HEMOGLOBIN A1C: Hemoglobin A1C,POC: 7.7 % — ABNORMAL HIGH

## 2020-12-14 MED ORDER — BUPROPION HCL 150 MG PO TB24 *I*
150.0000 mg | ORAL_TABLET | Freq: Every morning | ORAL | 3 refills | Status: DC
Start: 2020-12-14 — End: 2021-12-16

## 2020-12-14 NOTE — Progress Notes (Signed)
[facility] Follow-up Note     ReasonReason For Visit:   Follow up DM     HPI:      Dale Schultz is 45 y.o. year old male coming in to discuss the following issues:    T2DM- his last A1c was 9.3 and today it has improved to 7.7. He continues on Trulicity 3 mg, metformin and invokana. He has been working on diet. He does have some slowing of his GI system, denies nausea, but will have early satiety. He feels this is tolerable and he would rather stay on the medication.     Depression/Anxiety- continues on celexa and wellbutrin. Following with BH. Work has been terrible. 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 He knows that he needs to find a less toxic work environment and will talk more with his wife about the idea of taking additional leave from work. He denies SI/HI, has had some passive thoughts of wanting to take a break, or just escape. He feels he is able to better identify his stress triggers and has been having good communication with his wife.       HTN- BP has been well controlled.      Physical Exam:     Vitals:    12/14/20 0847   BP: 132/82   Pulse: 80   Temp: 35.4 C (95.7 F)   TempSrc: Temporal   Weight: 91.4 kg (201 lb 9.6 oz)       Vitals Signs: JWJ:XBJY mass index is 28.93 kg/m., otherwise as above, reviewed with patient    General: NAD  Psych: depressed mood and affect.   Appearance:  age appropriate   Behavior:  normal   Speech:  normal pitch and normal volume   Mood:  Anxious and Depressed   Affect:  mood-congruent   Thought Process:  Normal   Thought Content:  normal   Sensorium:  person, place, time/date and situation   Cognition:  grossly intact   Insight:  Age appropriate   Judgment:  Age appropriate           Assessment and Plan:   Dale Schultz is a 45 y.o. man with hx of HTN, HLD, T2DM, anxiety and depression presenting for follow  up. His anxiety and depression have worsened in the setting of toxic work environment.    Diagnoses and all orders for this visit:    Type 2 diabetes mellitus without complication, without long-term current use of insulin  - his A1c has improved significantly  - continue trulicity 3 mg, invokana, and metformin      Essential hypertension  - BP is at goal     Hyperlipidemia, unspecified hyperlipidemia type  - continue statin.     Anxiety/Depression, unspecified depression type  - increase wellbutrin to 450 mg to see if his anhedonia improves  - continue to follow with his psychologist  - continue Celexa  - close interval follow up in the clinic.     Other orders  -     POCT HEMOGLOBIN A1C      Handouts Given: Patient educational materials distributed by print-out and/or inserted into AVS   Follow-up:   RTC:  4 weeks    Author:   Raiford Noble, MD   General Medicine Division  [facility] [phone]  12/14/2020 7:54 AM  Additional Objective Data Listed Below.    Review of Systems:     12 point ROS negative except for HPI above.     Medications/Allergies/Immunizations:     Medication list reviewed and reconciled this visit in the EMR. Allergy list confirmed in the EMR.    Current Outpatient Medications   Medication Sig    buPROPion (WELLBUTRIN XL) 150 mg 24 hr tablet Take 1 tablet (150 mg total) by mouth every morning  Swallow whole. Do not crush, break, or chew.    metFORMIN (FORTAMET) 1,000 mg (OSM) 24 hr tablet TAKE 2 TABLETS BY MOUTH EVERY DAY WITH DINNER IN ADDITION TO 500 MG DOSE FOR A TOTAL DAILY DOSE OF 2500 MG PER DAY. SWALLOW WHOLE. DO NOT CUT, CRUSH OR CHEW    dulaglutide (TRULICITY) 3 MG/0.5ML pen Inject 0.5 mLs (3 mg total) into the skin every 7 days    buPROPion (WELLBUTRIN XL) 300 mg 24 hr tablet TAKE 1 TABLET BY MOUTH EVERY MORNING SWALLOW WHOLE, DO NOT BREAK, CRUSH OR CHEW    atorvastatin (LIPITOR) 20 mg tablet TAKE 1 TABLET BY MOUTH EVERY DAY    citalopram (CELEXA) 40 mg tablet TAKE 1 TABLET BY MOUTH  EVERY DAY    INVOKANA 300 MG tablet TAKE 1 TABLET BY MOUTH EVERY DAY BEFORE BREAKFAST    hydrOXYzine HCl (ATARAX) 25 MG tablet Take 0.5-1 tablets (12.5-25 mg total) by mouth at bedtime    metFORMIN (FORTAMET) 500 MG (OSM) 24 hr tablet Take 1 tablet (500 mg total) by mouth daily (with dinner) Take in addition to 2000 mg Dose for total of 2500 mg daily. Swallow whole. Do not cut, crush, or chew.    insulin pen needle (B-D UF III MINI PEN NEEDLES) 31G X 5 MM USE AS DIRECTED    FLUCELVAX QUADRIVALENT 0.5 ML syringe TO BE GIVEN BY PHARMACIST PER STANDING ORDER (Patient not taking: Reported on 06/22/2020)    alcohol swabs pads Use as directed (Patient not taking: Reported on 06/22/2020)    BAYER CONTOUR test strip tes 2-3 times daily as needed for 250.02    LANCETS ULTRA THIN MISC Test 3 times daily for 250.02 (Patient not taking: Reported on 06/22/2020)         Past Medical History/Family History/Social History:     Past Medical History:   Diagnosis Date    Anxiety     Diabetes mellitus     HLD (hyperlipidemia)        Past Medical History, Family History, and Social History reviewed, confirmed, and updated as appropriate this visit in the EMR.     Labs and Imaging:   Labs and Imaging results reviewed with patient.

## 2021-01-11 ENCOUNTER — Encounter: Payer: Self-pay | Admitting: Internal Medicine

## 2021-01-11 ENCOUNTER — Ambulatory Visit: Payer: BLUE CROSS/BLUE SHIELD | Admitting: Internal Medicine

## 2021-01-11 VITALS — BP 139/86 | HR 80 | Temp 95.5°F | Ht 70.0 in | Wt 198.0 lb

## 2021-01-11 DIAGNOSIS — F32A Depression, unspecified: Secondary | ICD-10-CM

## 2021-01-11 DIAGNOSIS — E119 Type 2 diabetes mellitus without complications: Secondary | ICD-10-CM

## 2021-01-11 DIAGNOSIS — I10 Essential (primary) hypertension: Secondary | ICD-10-CM

## 2021-01-11 DIAGNOSIS — F419 Anxiety disorder, unspecified: Secondary | ICD-10-CM

## 2021-01-11 NOTE — Progress Notes (Signed)
[facility] Follow-up Note     ReasonReason For Visit:   MDD/anxiety    HPI:      Dale Schultz is 45 y.o. year old male coming in to discuss the following issues:    His wellbutrin was increased to 450 mg last visit due to worsening depression/anxiety around work. He was having severe anhedonia as well. Following with his BH. Continues celexa as well. Feels that the wellbutrin has helped some, but his work has been extremely stressful. He has been talking with his wife and he does need to take some time off, wants to talk with his wife more about timing, but will contact this office to take him out of work.     Last A1c was 7.7 which was a significant improvement from last visit. He continues on Trulicity 3 mg, metformin and invokana    Physical Exam:     Vitals:    01/11/21 1117   BP: 139/86   Pulse: 80   Temp: 35.3 C (95.5 F)   TempSrc: Temporal   SpO2: 97%   Weight: 89.8 kg (198 lb)   Height: 1.778 m (5\' 10" )       Vitals Signs: mass index is 28.41 kg/m., otherwise as above, reviewed with patient    General: appears well, NAD  Psych:   Appearance:  age appropriate   Behavior:  normal   Speech:  normal pitch and normal volume   Mood:  Anxious and Depressed   Affect:  mood-congruent   Thought Process:  Normal   Thought Content:  normal   Sensorium:  person, place, time/date and situation   Cognition:  grossly intact   Insight:  Age appropriate   Judgment:  Age appropriate           Assessment and Plan:     Diagnoses and all orders for this visit:    Anxiety/Depression, unspecified depression type  - continue Celexa and Wellbutrin.   - following with BH  - he will likely benefit from another leave from work due to his worsening sxs when returning back to his current job.     Type 2 diabetes mellitus without complication, without long-term current use of insulin  - a1c improved, will recheck at next visit    Essential hypertension  - BP near goal, no medication changes today,        Handouts Given: Patient educational materials distributed by print-out and/or inserted into AVS   Follow-up:   RTC:  3 months for follow up      Author:   VPX:TGGY, MD   General Medicine Division  [facility] [phone]  01/11/2021 8:23 AM            Additional Objective Data Listed Below.    Review of Systems:     12 point ROS negative except for HPI above.     Medications/Allergies/Immunizations:     Medication list reviewed and reconciled this visit in the EMR. Allergy list confirmed in the EMR.    Current Outpatient Medications   Medication Sig    buPROPion (WELLBUTRIN XL) 150 mg 24 hr tablet Take 1 tablet (150 mg total) by mouth every morning  Swallow whole. Do not crush, break, or chew.    metFORMIN (FORTAMET) 1,000 mg (OSM) 24 hr tablet TAKE 2 TABLETS BY MOUTH EVERY DAY WITH DINNER IN ADDITION TO 500 MG DOSE FOR A TOTAL DAILY DOSE OF 2500 MG PER DAY. SWALLOW WHOLE. DO NOT CUT,  CRUSH OR CHEW    dulaglutide (TRULICITY) 3 MG/0.5ML pen Inject 0.5 mLs (3 mg total) into the skin every 7 days    buPROPion (WELLBUTRIN XL) 300 mg 24 hr tablet TAKE 1 TABLET BY MOUTH EVERY MORNING SWALLOW WHOLE, DO NOT BREAK, CRUSH OR CHEW    atorvastatin (LIPITOR) 20 mg tablet TAKE 1 TABLET BY MOUTH EVERY DAY    citalopram (CELEXA) 40 mg tablet TAKE 1 TABLET BY MOUTH EVERY DAY    INVOKANA 300 MG tablet TAKE 1 TABLET BY MOUTH EVERY DAY BEFORE BREAKFAST    hydrOXYzine HCl (ATARAX) 25 MG tablet Take 0.5-1 tablets (12.5-25 mg total) by mouth at bedtime    metFORMIN (FORTAMET) 500 MG (OSM) 24 hr tablet Take 1 tablet (500 mg total) by mouth daily (with dinner) Take in addition to 2000 mg Dose for total of 2500 mg daily. Swallow whole. Do not cut, crush, or chew.    insulin pen needle (B-D UF III MINI PEN NEEDLES) 31G X 5 MM USE AS DIRECTED    FLUCELVAX QUADRIVALENT 0.5 ML syringe TO BE GIVEN BY PHARMACIST PER STANDING ORDER (Patient not taking: Reported on 06/22/2020)    alcohol swabs pads Use as directed (Patient not taking:  Reported on 06/22/2020)    BAYER CONTOUR test strip tes 2-3 times daily as needed for 250.02    LANCETS ULTRA THIN MISC Test 3 times daily for 250.02 (Patient not taking: Reported on 06/22/2020)         Past Medical History/Family History/Social History:     Past Medical History:   Diagnosis Date    Anxiety     Diabetes mellitus     HLD (hyperlipidemia)        Past Medical History, Family History, and Social History reviewed, confirmed, and updated as appropriate this visit in the EMR.     Labs and Imaging:   Labs and Imaging results reviewed with patient.

## 2021-02-27 ENCOUNTER — Other Ambulatory Visit: Payer: Self-pay | Admitting: Gastroenterology

## 2021-02-27 NOTE — Procedures (Signed)
[facility]    Colonoscopy    Date of Procedure: 02/27/2021   Primary Physician: Raiford Noble, MD        Attending Physician: Beckie Busing. Victorio Palm, M.D.  Patient Name: Dale Schultz      Indications: Colorectal cancer screening-average risk    Previous colonoscopy: Yes -- Date: years ago    Medications:Fentanyl 100 mcg IV and Midazolam 5 mg IV were administered incrementally over the course of the procedure to achieve an adequate level of conscious sedation.    Pre-Procedure Physical:  Patient's medications, allergies, past medical, surgical, social and family histories were reviewed and updated as appropriate.    BP 133/102, P 80   Airway:  normal  Heart:  normal S1 and S2  Lungs:  clear  Abdomen:  soft, nontender, normal bowel sounds  Mental Status:  awake and alert; oriented to person, place, and time     ASA Classification:  2    Informed Consent: Prior to the procedure, the patient was explained the risk, benefits and alternatives including the risk of bleeding, infection or perforation and informed consent was obtained.     Procedure Details: The patient was placed in the left lateral decubitus position and monitored continuously with ECG tracing, pulse oximetry, blood pressure monitoring and direct observations. After anorectal examination was performed, the Olympus CF-H180was inserted into the rectum and advanced under direct vision to the terminal ileum. The procedure was considered not difficult..  Cecum withdrawal time:  6 minutes.    Narrative:  During withdrawal examination, the final quality of the prep was [facility] Bowel Prep Scale   Prep:    Right Colon: Grade3- (entire mucosa of colon segment seen well, with no residual staining, small fragments of stool, or opaque liguid)    Transverse Colon: Grade 3- (entire mucosa of colon segment seen well, with no residual staining, small fragments of stool, or opaque liguid)    Left Colon: Grade 3- (entire mucosa of colon  segment seen well, with no residual staining, small fragments of stool, or opaque liguid)    A careful inspection was made as the colonoscope was withdrawn, a retroflexed view of the rectum was included; findings and interventions are described below. Appropriate photo documentation was obtained. The patient  recovered in the [facility] recovery unit.    Findings:     Terminal Ileum: Normal.    Colon: The colon mucosa appeared normal without polyp, lesion or mass.  There was very early sigmoid colon diverticulosis.  Small internal hemorrhoids seen on retroflexion.    Complications: The patient tolerated the procedure well.    Impression:    The colon mucosa appeared normal without polyp, lesion or mass.    There was very early sigmoid colon diverticulosis.    Small internal hemorrhoids seen on retroflexion.    Recommendations:  1. Suggest high fiber diet.  2. Colonoscopy is recommended in 10 years for colorectal cancer screening.    Electronically signed: Nelda Bucks, MD    Note created: 02/27/2021  Office Phone # [phone]

## 2021-04-15 ENCOUNTER — Encounter: Payer: Self-pay | Admitting: Internal Medicine

## 2021-04-15 ENCOUNTER — Ambulatory Visit: Payer: BLUE CROSS/BLUE SHIELD | Attending: Internal Medicine | Admitting: Internal Medicine

## 2021-04-15 VITALS — BP 138/96 | HR 91 | Temp 96.6°F | Wt 198.0 lb

## 2021-04-15 DIAGNOSIS — E119 Type 2 diabetes mellitus without complications: Secondary | ICD-10-CM | POA: Insufficient documentation

## 2021-04-15 DIAGNOSIS — F32A Depression, unspecified: Secondary | ICD-10-CM | POA: Insufficient documentation

## 2021-04-15 DIAGNOSIS — I10 Essential (primary) hypertension: Secondary | ICD-10-CM | POA: Insufficient documentation

## 2021-04-15 DIAGNOSIS — F419 Anxiety disorder, unspecified: Secondary | ICD-10-CM | POA: Insufficient documentation

## 2021-04-15 LAB — POCT HEMOGLOBIN A1C: Hemoglobin A1C,POC: 9 % — ABNORMAL HIGH

## 2021-04-15 NOTE — Progress Notes (Signed)
[facility] Follow-up Note     ReasonReason For Visit:   Follow up     HPI:      Dale Schultz is 45 y.o. year old male coming in to discuss the following issues:    Anxiety/depression- he has been talking with his wife about taking more time off. He is on celexa and wellbutrin 450 mg. Following with BH. Summer has been tough with work. He notes that he is not thinking about taking time off now.     He feels like his stomach has gotten smaller, and he may have some fullness in his stomach. He has had slightly worsening tremor. He is eating a lot of fruits and vegetables. His taste for certain foods has changed, heavy cheese, and pepperoni.     T2DM- A1c was 7.7 last visit, he is on Trulicity 3 mg, metformin and invokana. Tolerating this well. His A1c today was 9 today, he knows that he can work on his diet.      HTN- he does not take his blood pressure at home.     Physical Exam:     Vitals:    04/15/21 1052   BP: (!) 138/96   Pulse: 91   Temp: 35.9 C (96.6 F)   TempSrc: Temporal   SpO2: 97%   Weight: 89.8 kg (198 lb)       Vitals Signs: JYN:WGNF mass index is 28.41 kg/m., otherwise as above, reviewed with patient    General: appears well, NAD   Neuro: no focal deficits   Psych: mood and affect wnl      Assessment and Plan:     Diagnoses and all orders for this visit:    Type 2 diabetes mellitus without complication, without long-term current use of insulin  - his a1c is above goal today at 9. He will continue on current dose of trulicity 3 mg (cannot increase due to some GI sxs, gastroparesis like sxs)  - continue metformin, invokana.   - he is working on dietary changes    AnxietyDepression, unspecified depression type  - continue close follow up with BH, and this office,  - continue Wellbutrin 450 mg and Celexa.   - will call if work stress rises similar to last year.     Essential hypertension  - advised to monitor BP at home.   - continue current regimen.   Other orders  -     POCT  HEMOGLOBIN A1C        Handouts Given: Patient educational materials distributed by print-out and/or inserted into AVS   Follow-up:   RTC:  3 months      Author:   Raiford Noble, MD   General Medicine Division  [facility] [phone]  04/15/2021 8:05 AM            Additional Objective Data Listed Below.    Review of Systems:     12 point ROS negative except for HPI above.     Medications/Allergies/Immunizations:     Medication list reviewed and reconciled this visit in the EMR. Allergy list confirmed in the EMR.    Current Outpatient Medications   Medication Sig    buPROPion (WELLBUTRIN XL) 150 mg 24 hr tablet Take 1 tablet (150 mg total) by mouth every morning  Swallow whole. Do not crush, break, or chew.    metFORMIN (FORTAMET) 1,000 mg (OSM) 24 hr tablet TAKE 2 TABLETS BY MOUTH EVERY DAY WITH DINNER IN ADDITION TO 500 MG  DOSE FOR A TOTAL DAILY DOSE OF 2500 MG PER DAY. SWALLOW WHOLE. DO NOT CUT, CRUSH OR CHEW    dulaglutide (TRULICITY) 3 MG/0.5ML pen Inject 0.5 mLs (3 mg total) into the skin every 7 days    buPROPion (WELLBUTRIN XL) 300 mg 24 hr tablet TAKE 1 TABLET BY MOUTH EVERY MORNING SWALLOW WHOLE, DO NOT BREAK, CRUSH OR CHEW    atorvastatin (LIPITOR) 20 mg tablet TAKE 1 TABLET BY MOUTH EVERY DAY    citalopram (CELEXA) 40 mg tablet TAKE 1 TABLET BY MOUTH EVERY DAY    INVOKANA 300 MG tablet TAKE 1 TABLET BY MOUTH EVERY DAY BEFORE BREAKFAST    hydrOXYzine HCl (ATARAX) 25 MG tablet Take 0.5-1 tablets (12.5-25 mg total) by mouth at bedtime    metFORMIN (FORTAMET) 500 MG (OSM) 24 hr tablet Take 1 tablet (500 mg total) by mouth daily (with dinner) Take in addition to 2000 mg Dose for total of 2500 mg daily. Swallow whole. Do not cut, crush, or chew.    insulin pen needle (B-D UF III MINI PEN NEEDLES) 31G X 5 MM USE AS DIRECTED    FLUCELVAX QUADRIVALENT 0.5 ML syringe TO BE GIVEN BY PHARMACIST PER STANDING ORDER (Patient not taking: Reported on 06/22/2020)    alcohol swabs pads Use as directed (Patient not taking:  Reported on 06/22/2020)    BAYER CONTOUR test strip tes 2-3 times daily as needed for 250.02    LANCETS ULTRA THIN MISC Test 3 times daily for 250.02 (Patient not taking: Reported on 06/22/2020)         Past Medical History/Family History/Social History:     Past Medical History:   Diagnosis Date    Anxiety     Diabetes mellitus     HLD (hyperlipidemia)        Past Medical History, Family History, and Social History reviewed, confirmed, and updated as appropriate this visit in the EMR.     Labs and Imaging:   Labs and Imaging results reviewed with patient.

## 2021-04-16 ENCOUNTER — Encounter: Payer: Self-pay | Admitting: Internal Medicine

## 2021-06-14 ENCOUNTER — Ambulatory Visit: Payer: BLUE CROSS/BLUE SHIELD | Admitting: Optometry

## 2021-07-10 ENCOUNTER — Other Ambulatory Visit: Payer: Self-pay | Admitting: Internal Medicine

## 2021-07-10 DIAGNOSIS — F332 Major depressive disorder, recurrent severe without psychotic features: Secondary | ICD-10-CM

## 2021-07-10 DIAGNOSIS — F411 Generalized anxiety disorder: Secondary | ICD-10-CM

## 2021-07-10 DIAGNOSIS — E119 Type 2 diabetes mellitus without complications: Secondary | ICD-10-CM

## 2021-07-10 NOTE — Telephone Encounter (Signed)
Refill request routed to Dr. Mahler 07/10/2021 11:03 AM

## 2021-07-12 ENCOUNTER — Other Ambulatory Visit
Admission: RE | Admit: 2021-07-12 | Discharge: 2021-07-12 | Disposition: A | Discharge status: Home / Self Care | Payer: BLUE CROSS/BLUE SHIELD | Source: Ambulatory Visit

## 2021-07-12 ENCOUNTER — Ambulatory Visit: Payer: BLUE CROSS/BLUE SHIELD | Attending: Internal Medicine | Admitting: Internal Medicine

## 2021-07-12 ENCOUNTER — Encounter: Payer: Self-pay | Admitting: Internal Medicine

## 2021-07-12 ENCOUNTER — Other Ambulatory Visit: Payer: Self-pay

## 2021-07-12 VITALS — BP 115/65 | HR 94 | Temp 96.1°F | Wt 195.2 lb

## 2021-07-12 DIAGNOSIS — E119 Type 2 diabetes mellitus without complications: Secondary | ICD-10-CM | POA: Insufficient documentation

## 2021-07-12 DIAGNOSIS — I10 Essential (primary) hypertension: Secondary | ICD-10-CM | POA: Insufficient documentation

## 2021-07-12 DIAGNOSIS — F419 Anxiety disorder, unspecified: Secondary | ICD-10-CM | POA: Insufficient documentation

## 2021-07-12 DIAGNOSIS — F32A Depression, unspecified: Secondary | ICD-10-CM | POA: Insufficient documentation

## 2021-07-12 LAB — LIPID PANEL
Chol/HDL Ratio: 3.4
Cholesterol: 145 mg/dL
HDL: 43 mg/dL (ref 40–60)
LDL Calculated: 70 mg/dL
Non HDL Cholesterol: 102 mg/dL
Triglycerides: 159 mg/dL — AB

## 2021-07-12 LAB — COMPREHENSIVE METABOLIC PANEL
ALT: 34 U/L (ref 0–50)
AST: 24 U/L (ref 0–50)
Albumin: 4.8 g/dL (ref 3.5–5.2)
Alk Phos: 43 U/L (ref 40–130)
Anion Gap: 12 (ref 7–16)
Bilirubin,Total: 0.3 mg/dL (ref 0.0–1.2)
CO2: 28 mmol/L (ref 20–28)
Calcium: 10.1 mg/dL (ref 8.6–10.2)
Chloride: 99 mmol/L (ref 96–108)
Creatinine: 0.87 mg/dL (ref 0.67–1.17)
Glucose: 182 mg/dL — ABNORMAL HIGH (ref 60–99)
Lab: 17 mg/dL (ref 6–20)
Potassium: 4.8 mmol/L (ref 3.3–5.1)
Sodium: 139 mmol/L (ref 133–145)
Total Protein: 6.9 g/dL (ref 6.3–7.7)
eGFR BY CREAT: 107 *

## 2021-07-12 LAB — MICROALBUMIN, URINE, RANDOM
Creatinine,UR: 46 mg/dL (ref 20–300)
Microalbumin,UR: <1.2 mg/dL

## 2021-07-12 LAB — POCT HEMOGLOBIN A1C: Hemoglobin A1C,POC: 7.8 % — ABNORMAL HIGH

## 2021-07-12 NOTE — Progress Notes (Signed)
[facility] Follow-up Note     ReasonReason For Visit:   Follow up     HPI:      Dale Schultz is 45 y.o. year old male coming in to discuss the following issues:    T2DM- he continues on Trulicity 3 mg, metformin and invokana. He has been working on dietary changes. He has been having less gastroparesis sxs, nausea. Still taking the metformin, and invokana. His BM have decreased to once per day. More solid than it has been in the past as well.    Anxiety/depression- continues on wellbutrin 450 mg and Celexa. He is stressed with work, and life right now. His mood is stable. Has been having some tremor. Unsure if this is worse with caffeine.     HTN- his blood pressure has been well controlled at home.     Physical Exam:     Vitals:    07/12/21 0907   BP: 115/65   Pulse: 94   Temp: 35.6 C (96.1 F)   TempSrc: Temporal   SpO2: 99%   Weight: 88.5 kg (195 lb 3.2 oz)       Vitals Signs: YWV:PXTG mass index is 28.01 kg/m., otherwise as above, reviewed with patient    General: appears well, nad  Neuro: no focal deficit. Fine intention tremor with full extension of the hands. No asterixis, no head tremor.   Psych: affect and mood wnl.       Assessment and Plan:     Diagnoses and all orders for this visit:    Type 2 diabetes mellitus without complication, without long-term current use of insulin- A1c improved from prior. Will continue current regimen. If a1c stays above 7 will increase trulicity now that his GI sxs have improved.   -     Comprehensive metabolic panel; Future  -     Lipid Panel (Reflex to Direct  LDL if Triglycerides more than 400); Future  -     Microalbumin, Urine, Random; Future    Essential hypertension  - BP is at goal     Anxiety/Depression, unspecified depression type  - slight tremor likely from Wellbutrin, not impacting function and mood is stable.   - no change to meds today     Other orders  -     POCT HEMOGLOBIN A1C        Handouts Given: Patient educational materials  distributed by print-out and/or inserted into AVS   Follow-up:   RTC:  3 months for follow up DM.        Author:   Raiford Noble, MD   General Medicine Division  [facility] [phone]  07/12/2021 8:33 AM            Additional Objective Data Listed Below.    Review of Systems:     12 point ROS negative except for HPI above.     Medications/Allergies/Immunizations:     Medication list reviewed and reconciled this visit in the EMR. Allergy list confirmed in the EMR.    Current Outpatient Medications   Medication Sig    citalopram (CELEXA) 40 mg tablet TAKE 1 TABLET BY MOUTH EVERY DAY    INVOKANA 300 MG tablet TAKE 1 TABLET BY MOUTH EVERY DAY BEFORE BREAKFAST    metFORMIN (FORTAMET) 1,000 mg (OSM) 24 hr tablet TAKE 2 TABLETS BY MOUTH EVERY DAY WITH DINNER IN ADDITION TO 500 MG DOSE FOR A TOTAL DAILY DOSE OF 2500 MG PER DAY. SWALLOW WHOLE. DO NOT CUT,  CRUSH OR CHEW    dulaglutide (TRULICITY) 3 MG/0.5ML pen Inject 0.5 mLs (3 mg total) into the skin every 7 days    buPROPion (WELLBUTRIN XL) 300 mg 24 hr tablet TAKE 1 TABLET BY MOUTH EVERY MORNING SWALLOW WHOLE, DO NOT BREAK, CRUSH OR CHEW    atorvastatin (LIPITOR) 20 mg tablet TAKE 1 TABLET BY MOUTH EVERY DAY    hydrOXYzine HCl (ATARAX) 25 MG tablet Take 0.5-1 tablets (12.5-25 mg total) by mouth at bedtime    metFORMIN (FORTAMET) 500 MG (OSM) 24 hr tablet Take 1 tablet (500 mg total) by mouth daily (with dinner) Take in addition to 2000 mg Dose for total of 2500 mg daily. Swallow whole. Do not cut, crush, or chew.    insulin pen needle (B-D UF III MINI PEN NEEDLES) 31G X 5 MM USE AS DIRECTED    alcohol swabs pads Use as directed (Patient not taking: Reported on 06/22/2020)    BAYER CONTOUR test strip tes 2-3 times daily as needed for 250.02    LANCETS ULTRA THIN MISC Test 3 times daily for 250.02 (Patient not taking: Reported on 06/22/2020)         Past Medical History/Family History/Social History:     Past Medical History:   Diagnosis Date    Anxiety     Diabetes  mellitus     HLD (hyperlipidemia)        Past Medical History, Family History, and Social History reviewed, confirmed, and updated as appropriate this visit in the EMR.     Labs and Imaging:   Labs and Imaging results reviewed with patient.

## 2021-07-13 ENCOUNTER — Other Ambulatory Visit: Payer: Self-pay | Admitting: Primary Care

## 2021-07-22 ENCOUNTER — Other Ambulatory Visit: Payer: Self-pay | Admitting: Internal Medicine

## 2021-07-22 DIAGNOSIS — F32A Depression, unspecified: Secondary | ICD-10-CM

## 2021-07-22 NOTE — Telephone Encounter (Signed)
Refill request routed to Dr. Mahler 07/22/2021 12:48 PM

## 2021-08-17 ENCOUNTER — Other Ambulatory Visit: Payer: Self-pay | Admitting: Internal Medicine

## 2021-08-17 DIAGNOSIS — E119 Type 2 diabetes mellitus without complications: Secondary | ICD-10-CM

## 2021-08-20 NOTE — Telephone Encounter (Signed)
Refill request routed to Dr. Al Corpus 08/20/2021 8:14 AM

## 2021-09-20 ENCOUNTER — Other Ambulatory Visit: Payer: Self-pay | Admitting: Internal Medicine

## 2021-09-20 NOTE — Telephone Encounter (Signed)
Discussed with pharmacist. Maximum dose of metformin ER is 2000 mg daily, Currently prescribed for 2500 mg daily. Will change dose and ask nursing to contact patient to discuss the maximum recommended dose is 2000 mg daily so refill will be for this amount and I recommend he only take 2000 mg daily.

## 2021-09-20 NOTE — Telephone Encounter (Signed)
Called patient, left voicemail to return call. Provided call back number. Also sent mychart message.

## 2021-09-20 NOTE — Telephone Encounter (Signed)
Dr. Mahler unavailable, refill request routed to Dr. Hazen 09/20/2021 8:09 AM

## 2021-09-26 ENCOUNTER — Other Ambulatory Visit: Payer: Self-pay | Admitting: Internal Medicine

## 2021-09-26 DIAGNOSIS — E119 Type 2 diabetes mellitus without complications: Secondary | ICD-10-CM

## 2021-09-26 MED ORDER — METFORMIN HCL 500 MG PO TB24 *I*
2000.0000 mg | ORAL_TABLET | Freq: Every day | ORAL | 3 refills | Status: DC
Start: 2021-09-26 — End: 2022-09-17

## 2021-10-01 ENCOUNTER — Other Ambulatory Visit: Payer: Self-pay | Admitting: Internal Medicine

## 2021-10-01 NOTE — Telephone Encounter (Signed)
Refill request routed to Dr. Mahler 10/01/2021 8:34 AM

## 2021-10-21 ENCOUNTER — Other Ambulatory Visit: Payer: Self-pay

## 2021-10-21 ENCOUNTER — Encounter: Payer: Self-pay | Admitting: Internal Medicine

## 2021-10-21 ENCOUNTER — Ambulatory Visit: Payer: BLUE CROSS/BLUE SHIELD | Attending: Internal Medicine | Admitting: Internal Medicine

## 2021-10-21 VITALS — BP 141/79 | HR 82 | Temp 96.8°F | Wt 191.4 lb

## 2021-10-21 DIAGNOSIS — E785 Hyperlipidemia, unspecified: Secondary | ICD-10-CM | POA: Insufficient documentation

## 2021-10-21 DIAGNOSIS — F419 Anxiety disorder, unspecified: Secondary | ICD-10-CM | POA: Insufficient documentation

## 2021-10-21 DIAGNOSIS — Z114 Encounter for screening for human immunodeficiency virus [HIV]: Secondary | ICD-10-CM | POA: Insufficient documentation

## 2021-10-21 DIAGNOSIS — Z23 Encounter for immunization: Secondary | ICD-10-CM | POA: Insufficient documentation

## 2021-10-21 DIAGNOSIS — Z1159 Encounter for screening for other viral diseases: Secondary | ICD-10-CM | POA: Insufficient documentation

## 2021-10-21 DIAGNOSIS — I10 Essential (primary) hypertension: Secondary | ICD-10-CM | POA: Insufficient documentation

## 2021-10-21 DIAGNOSIS — F32A Depression, unspecified: Secondary | ICD-10-CM | POA: Insufficient documentation

## 2021-10-21 DIAGNOSIS — E119 Type 2 diabetes mellitus without complications: Secondary | ICD-10-CM | POA: Insufficient documentation

## 2021-10-21 LAB — POCT HEMOGLOBIN A1C: Hemoglobin A1C,POC: 7.2 % — ABNORMAL HIGH

## 2021-10-21 LAB — HM DIABETES FOOT EXAM

## 2021-10-21 NOTE — Progress Notes (Signed)
Standard Prevnar-20 vaccine given in office left deltoid IM. Tolerated.

## 2021-10-21 NOTE — Progress Notes (Signed)
[facility] Follow-up Note     ReasonReason For Visit:   Follow up     HPI:      Dale Schultz is 46 y.o. year old male coming in to discuss the following issues:    T2dm- most recent A1c was 7.8  Back in Nov. This hd improved from 9 in August. Co tinues on trulicity 3 mg, metformin, Invokana. He is having normal BM now.     HTN- his BP at home has been very well cotnrolled at home.     MDD/anxiety- continues on the Celexa 40 mg, and wellbutrin 300 mg. Notes that work is still stressful.     Due for Tdap, and prevnar 20. Hep C/HIV screening.     Physical Exam:     Vitals:    10/21/21 0907   BP: 141/79   Pulse: 82   Temp: 36 C (96.8 F)   SpO2: 100%   Weight: 86.8 kg (191 lb 6.4 oz)       Vitals Signs: NTI:RWER mass index is 27.46 kg/m., otherwise as above, reviewed with patient    General: appears well, nad   HEENT: TM clear b/l  Neck: no lad   Pulmonary: n-wob, cta b/l  Cardiovascular: RRR, no mrg, 2+ DP and PT pulses. Normal cap refill in feet.   Skin: no ulcerations on the feet.   Neuro: normal monofilament testing, bilateral, normal proprioception   Psych: mood and affect wnl       Assessment and Plan:   Diagnoses and all orders for this visit:    Type 2 diabetes mellitus without complication, without long-term current use of insulin- his a1c is near goal, improved from prior. Commended on weight loss. Continue current invokana 300 mg, Trulicity 3 mg, and metformin. DM foot exam completed today.   -     Hemoglobin A1c; Future  -     Lipid Panel (Reflex to Direct  LDL if Triglycerides more than 400); Future  -     Comprehensive metabolic panel; Future    Essential hypertension  - BP is at goal at home.    Hyperlipidemia, unspecified hyperlipidemia type  - continue lipitor    Anxiety/Depression, unspecified depression type  - mood stable on Wellbutrin and Celexa. Continue BH follow up    Screening for HIV without presence of risk factors  -     HIV 1&2 antigen/antibody;  Future    Encounter for hepatitis C screening test for low risk patient  -     Hepatitis C antibody; Future    Need for pneumococcal vaccination  -     Pneumococcal 20-valent conj vaccine    Need for vaccination  -     Tdap >/= 56yr(Boostrix)    Other orders  -     POCT HEMOGLOBIN A1C        Handouts Given: Patient educational materials distributed by print-out and/or inserted into AVS   Follow-up:   RTC:  6 month follow up. Blood work prior to follow up apptment.       Author:   Raiford Noble, MD   General Medicine Division  [facility] [phone]  10/21/2021 8:14 AM            Additional Objective Data Listed Below.    Review of Systems:     12 point ROS negative except for HPI above.     Medications/Allergies/Immunizations:     Medication list reviewed and reconciled this visit in the EMR.  Allergy list confirmed in the EMR.    Current Outpatient Medications   Medication Sig    TRULICITY 3 MG/0.5ML pen INJECT 0.5ML (3 MG TOTAL) SUBCUTANEOUSLY (UNDER THE SKIN) EVERY 7 DAYS    atorvastatin (LIPITOR) 20 mg tablet TAKE 1 TABLET BY MOUTH EVERY DAY    metFORMIN (GLUCOPHAGE-XR) 500 mg 24 hr tablet Take 4 tablets (2,000 mg total) by mouth daily (with dinner)  Swallow whole. Do not crush, break, or chew.    buPROPion (WELLBUTRIN XL) 300 mg 24 hr tablet TAKE 1 TABLET BY MOUTH EVERY MORNING SWALLOW WHOLE, DO NOT BREAK, CRUSH OR CHEW    citalopram (CELEXA) 40 mg tablet TAKE 1 TABLET BY MOUTH EVERY DAY    INVOKANA 300 MG tablet TAKE 1 TABLET BY MOUTH EVERY DAY BEFORE BREAKFAST    hydrOXYzine HCl (ATARAX) 25 MG tablet Take 0.5-1 tablets (12.5-25 mg total) by mouth at bedtime    insulin pen needle (B-D UF III MINI PEN NEEDLES) 31G X 5 MM USE AS DIRECTED    alcohol swabs pads Use as directed (Patient not taking: Reported on 06/22/2020)    BAYER CONTOUR test strip tes 2-3 times daily as needed for 250.02    LANCETS ULTRA THIN MISC Test 3 times daily for 250.02 (Patient not taking: Reported on 06/22/2020)         Past Medical  History/Family History/Social History:     Past Medical History:   Diagnosis Date    Anxiety     Diabetes mellitus     HLD (hyperlipidemia)        Past Medical History, Family History, and Social History reviewed, confirmed, and updated as appropriate this visit in the EMR.     Labs and Imaging:   Labs and Imaging results reviewed with patient.

## 2021-11-21 ENCOUNTER — Other Ambulatory Visit: Payer: Self-pay | Admitting: Internal Medicine

## 2021-11-21 DIAGNOSIS — F419 Anxiety disorder, unspecified: Secondary | ICD-10-CM

## 2021-11-21 NOTE — Telephone Encounter (Signed)
Medication request routed to Dr. Lianne Bushy for processing 11/21/2021 8:50 AM

## 2021-12-02 ENCOUNTER — Other Ambulatory Visit: Payer: Self-pay | Admitting: Internal Medicine

## 2021-12-02 DIAGNOSIS — F419 Anxiety disorder, unspecified: Secondary | ICD-10-CM

## 2021-12-11 ENCOUNTER — Ambulatory Visit: Payer: BLUE CROSS/BLUE SHIELD | Admitting: Optometry

## 2021-12-15 ENCOUNTER — Encounter: Payer: Self-pay | Admitting: Internal Medicine

## 2021-12-16 ENCOUNTER — Other Ambulatory Visit: Payer: Self-pay | Admitting: Internal Medicine

## 2021-12-16 DIAGNOSIS — F419 Anxiety disorder, unspecified: Secondary | ICD-10-CM

## 2021-12-16 MED ORDER — BUPROPION HCL 150 MG PO TB24 *I*
150.0000 mg | ORAL_TABLET | Freq: Every morning | ORAL | 3 refills | Status: DC
Start: 2021-12-16 — End: 2022-12-02

## 2021-12-16 NOTE — Telephone Encounter (Signed)
Medication request routed to Dr. Al Corpus for processing 12/16/2021 12:04 PM    If appropriate, please fill the script for 90 days.

## 2021-12-25 ENCOUNTER — Other Ambulatory Visit: Payer: Self-pay

## 2021-12-25 ENCOUNTER — Ambulatory Visit: Payer: BLUE CROSS/BLUE SHIELD | Admitting: Optometrist

## 2021-12-25 ENCOUNTER — Encounter: Payer: Self-pay | Admitting: Optometrist

## 2021-12-25 DIAGNOSIS — H5211 Myopia, right eye: Secondary | ICD-10-CM

## 2021-12-25 DIAGNOSIS — H26492 Other secondary cataract, left eye: Secondary | ICD-10-CM

## 2021-12-25 DIAGNOSIS — H524 Presbyopia: Secondary | ICD-10-CM

## 2021-12-25 DIAGNOSIS — H52221 Regular astigmatism, right eye: Secondary | ICD-10-CM

## 2021-12-25 DIAGNOSIS — E119 Type 2 diabetes mellitus without complications: Secondary | ICD-10-CM

## 2021-12-25 NOTE — Progress Notes (Signed)
Assessment:  1. DM type II without retinopathy or DME OU   -onset ~2005, per chart review    Lab Results   Component Value Date    HA1C 9.6 (H) 06/14/2020     2. Pseudophakia with early PCO OS (off-axis)   -pt asymptomatic  3. Myopia/astigmatism OD with presbyopia OU.   -pt comfortable without use of Rx     Plan:  1. Proper glycemic and hypertensive control advised. Pt educated on importance of yearly dilated exams.  2,3. Pt ed on refractive findings; no Rx preferred at today's visit. Will repeat refraction prn.    RTC 1 year for DM eye exam, sooner prn.

## 2022-04-21 ENCOUNTER — Ambulatory Visit: Payer: BLUE CROSS/BLUE SHIELD | Attending: Internal Medicine | Admitting: Internal Medicine

## 2022-04-21 ENCOUNTER — Other Ambulatory Visit: Payer: Self-pay

## 2022-04-21 ENCOUNTER — Encounter: Payer: Self-pay | Admitting: Internal Medicine

## 2022-04-21 VITALS — BP 127/75 | HR 81 | Temp 96.5°F | Ht 70.0 in | Wt 198.0 lb

## 2022-04-21 DIAGNOSIS — F32A Depression, unspecified: Secondary | ICD-10-CM | POA: Insufficient documentation

## 2022-04-21 DIAGNOSIS — E785 Hyperlipidemia, unspecified: Secondary | ICD-10-CM | POA: Insufficient documentation

## 2022-04-21 DIAGNOSIS — F419 Anxiety disorder, unspecified: Secondary | ICD-10-CM | POA: Insufficient documentation

## 2022-04-21 DIAGNOSIS — I10 Essential (primary) hypertension: Secondary | ICD-10-CM | POA: Insufficient documentation

## 2022-04-21 DIAGNOSIS — E119 Type 2 diabetes mellitus without complications: Secondary | ICD-10-CM | POA: Insufficient documentation

## 2022-04-21 LAB — POCT HEMOGLOBIN A1C: Hemoglobin A1C,POC: 8 % — ABNORMAL HIGH

## 2022-04-21 NOTE — Progress Notes (Signed)
[facility] Follow-up Note     ReasonReason For Visit:   Follow up     HPI:      Dale Schultz is 46 y.o. year old male coming in to discuss the following issues:    T2DM- last a1c was 7.2 back in Feb. He continues on metformin 2000 mg, trulicity 3 mg, invokana 300 mg. A1c today was 8.0. Continues on atorvastatin 20 mg. He notes his diet has been a bit off recently, he is maintaining his weight under 200.     He has been having a lot of left sided wrist pain, will get a sharp shooting pain in the medial/ulnar aspect of his wrist. This came on gradually, has been wearing a brace. He has some numbness in his hands b/l but this is unrelated to his medial wrist pain. Marland Kitchen Has been using ibuprofen 800 mg twice. Worse with cleaning the pool and any movement that pushes his wrist to extremes of flexion and extension.     Right elbow pain- similar to his wrist pain,. This Is with overexertion, but will be painful with movements. He notes that this is with cleaning the pain.     HTN- off of BP meds.     Anxiety/MDD- he continues on wellbutrin 450 mg and Celexa 40 mg. Following with therapist. His mood has been pretty good so far. He feels a bit anxious about coming back to school. No SI/HI. Will have some down moments. He will be meeting with his BH coming up.     Physical Exam:     Vitals:    04/21/22 0839   BP: 127/75   BP Location: Right arm   Patient Position: Sitting   Cuff Size: large adult   Pulse: 81   Temp: 35.8 ?C (96.5 ?F)   TempSrc: Temporal   SpO2: 98%   Weight: 89.8 kg (198 lb)   Height: 1.778 m (5\' 10" )       Vitals Signs: ZOX:WRUE mass index is 28.41 kg/m?., otherwise as above, reviewed with patient    General: appears well, very pleasant. NAD  HEENT: mmm  Neck: no lad, no thyromegaly  Pulmonary: CTA b/l, n-wob  Cardiovascular: RRR, no mrg, no le edema   Neuro: no focal deficit, no tremor   Psych: mood and affect wnl       Assessment and Plan:   Dale Schultz is a 46 y.o. with hx of  T2DM, HTN, HLD, anxiety/mdd presenting for follow up.     Diagnoses and all orders for this visit:    Type 2 diabetes mellitus without complication, without long-term current use of insulin  - a1c is above goal at 8 today.   - he will work on dietary changes  - follow up in 3 months and increase Trulicity to 4,5 mg if still above goal  - continue metformin 2000 mg and invokana 300 mg     Essential hypertension  - well controlled off BP meds     Hyperlipidemia, unspecified hyperlipidemia type  - continue statin     Depression, unspecified depression type/Anxiety  - continue Celexa and Wellbutrin 450 mg  - anticipatory guidance for upcoming school year     Other orders  -     POCT HEMOGLOBIN A1C    Left wrist tendinopathy/Right elbow epicondylitis- will wear braces on wrist and elbow when performing repetitive movements. IBU 600 mg BID for 7 days. Will follow up if sxs worsen.  Handouts Given: Patient educational materials distributed by print-out and/or inserted into AVS   Follow-up:   RTC:  3 months for follow up a1c and wrist/elbow pain    Author:   Raiford Noble, MD   General Medicine Division  [facility] [phone]  04/21/2022 7:52 AM            Additional Objective Data Listed Below.    Review of Systems:     12 point ROS negative except for HPI above.     Medications/Allergies/Immunizations:     Medication list reviewed and reconciled this visit in the EMR. Allergy list confirmed in the EMR.    Current Outpatient Medications   Medication Sig   ? buPROPion (WELLBUTRIN XL) 150 mg 24 hr tablet Take 1 tablet (150 mg total) by mouth every morning  Swallow whole. Do not crush, break, or chew.   ? buPROPion (WELLBUTRIN XL) 300 mg 24 hr tablet Take 1 tablet (300 mg total) by mouth every morning  SWALLOW WHOLE, DO NOT BREAK,CRUSH OR CHEW   ? atorvastatin (LIPITOR) 20 mg tablet TAKE 1 TABLET BY MOUTH EVERY DAY   ? metFORMIN (GLUCOPHAGE-XR) 500 mg 24 hr tablet Take 4 tablets (2,000 mg total) by mouth daily (with dinner)  Swallow  whole. Do not crush, break, or chew.   ? TRULICITY 3 MG/0.5ML pen INJECT 0.5ML (3 MG TOTAL) SUBCUTANEOUSLY (UNDER THE SKIN) EVERY 7 DAYS   ? citalopram (CELEXA) 40 mg tablet TAKE 1 TABLET BY MOUTH EVERY DAY   ? INVOKANA 300 MG tablet TAKE 1 TABLET BY MOUTH EVERY DAY BEFORE BREAKFAST   ? hydrOXYzine HCl (ATARAX) 25 MG tablet Take 0.5-1 tablets (12.5-25 mg total) by mouth at bedtime (Patient not taking: Reported on 04/21/2022)   ? insulin pen needle (B-D UF III MINI PEN NEEDLES) 31G X 5 MM USE AS DIRECTED   ? alcohol swabs pads Use as directed (Patient not taking: Reported on 06/22/2020)   ? BAYER CONTOUR test strip tes 2-3 times daily as needed for 250.02   ? LANCETS ULTRA THIN MISC Test 3 times daily for 250.02 (Patient not taking: Reported on 06/22/2020)         Past Medical History/Family History/Social History:     Past Medical History:   Diagnosis Date   ? Anxiety    ? Diabetes mellitus    ? HLD (hyperlipidemia)        Past Medical History, Family History, and Social History reviewed, confirmed, and updated as appropriate this visit in the EMR.     Labs and Imaging:   Labs and Imaging results reviewed with patient.

## 2022-07-01 ENCOUNTER — Encounter: Payer: Self-pay | Admitting: Internal Medicine

## 2022-07-11 ENCOUNTER — Other Ambulatory Visit: Payer: Self-pay | Admitting: Internal Medicine

## 2022-07-11 DIAGNOSIS — F332 Major depressive disorder, recurrent severe without psychotic features: Secondary | ICD-10-CM

## 2022-07-11 DIAGNOSIS — E119 Type 2 diabetes mellitus without complications: Secondary | ICD-10-CM

## 2022-07-11 DIAGNOSIS — F411 Generalized anxiety disorder: Secondary | ICD-10-CM

## 2022-07-11 NOTE — Telephone Encounter (Signed)
Refill request routed to Dr. Al Corpus 07/11/2022 7:55 AM

## 2022-07-22 ENCOUNTER — Other Ambulatory Visit: Payer: Self-pay | Admitting: Internal Medicine

## 2022-07-22 DIAGNOSIS — E119 Type 2 diabetes mellitus without complications: Secondary | ICD-10-CM

## 2022-07-22 NOTE — Telephone Encounter (Signed)
Medication request routed to Dr. Al Corpus for processing 07/22/2022 8:16 AM    If appropriate, please fill the script for 90 days.

## 2022-07-23 ENCOUNTER — Encounter: Payer: Self-pay | Admitting: Internal Medicine

## 2022-07-23 ENCOUNTER — Ambulatory Visit: Payer: BLUE CROSS/BLUE SHIELD | Attending: Internal Medicine | Admitting: Internal Medicine

## 2022-07-23 ENCOUNTER — Other Ambulatory Visit: Payer: Self-pay

## 2022-07-23 VITALS — BP 133/78 | HR 84 | Temp 96.7°F | Wt 189.8 lb

## 2022-07-23 DIAGNOSIS — F32A Depression, unspecified: Secondary | ICD-10-CM | POA: Insufficient documentation

## 2022-07-23 DIAGNOSIS — E119 Type 2 diabetes mellitus without complications: Secondary | ICD-10-CM | POA: Insufficient documentation

## 2022-07-23 DIAGNOSIS — F419 Anxiety disorder, unspecified: Secondary | ICD-10-CM | POA: Insufficient documentation

## 2022-07-23 DIAGNOSIS — E785 Hyperlipidemia, unspecified: Secondary | ICD-10-CM | POA: Insufficient documentation

## 2022-07-23 DIAGNOSIS — I10 Essential (primary) hypertension: Secondary | ICD-10-CM | POA: Insufficient documentation

## 2022-07-23 LAB — POCT HEMOGLOBIN A1C: Hemoglobin A1C,POC: 7.9 % — ABNORMAL HIGH

## 2022-07-23 MED ORDER — DULAGLUTIDE 4.5 MG/0.5ML SC SOAJ *I*
4.5000 mg | SUBCUTANEOUS | 3 refills | Status: DC
Start: 2022-07-23 — End: 2023-02-02

## 2022-07-23 NOTE — Progress Notes (Signed)
[facility] Follow-up Note     ReasonReason For Visit:   Follow up     HPI:      Dale Schultz is 46 y.o. year old male coming in to discuss the following issues:    T2dm- He continues on metformin 2000 mg, trulicity 3 mg, invokana 300 mg and trulicity 3 mg. Continues on statin. He is feeling. He denies GI sxs.     HTN- BP has been controlled off of medications.     Anxiety/MDD- continues to follow with therapist. On Celexa and Wellbutrin. He is feeling very well recently. It has been a while since he has seen his therapist, but overall he is feeling well.      Left wrist and right elbow pain- his left wrist is still a bit painful. His right elbow is still painful at times. He is still using advil for this pain. This will be exacerbated when he is overusing    Physical Exam:     Vitals:    07/23/22 0846   BP: 133/78   Pulse: 84   Temp: 35.9 C (96.7 F)   SpO2: 97%   Weight: 86.1 kg (189 lb 12.8 oz)       Vitals Signs: ZOX:WRUE mass index is 27.23 kg/m., otherwise as above, reviewed with patient    General: appears well, nad   HEENT: MMM, tm clear b/l  Neck: no lad, no thyromegaly   Pulmonary: CTA b/l, n-wob   Cardiovascular: RRR, no mrg   Neuro: no focal deficits, no tremor  Psych: Mood and affect wnl   Ext  TTP over the olecranon on the right elbow. No effusion, no mass or erythema noted.       Assessment and Plan:     Diagnoses and all orders for this visit:    Type 2 diabetes mellitus without complication, without long-term current use of insulin- A1c is still above goal at 7.9 today. Will plan to increase trulicity to 4.5 mg weekly. He will also need to change to jardiance in Jan from [place] due to insurance coverage change.   -     Microalbumin, Urine, Random; Future  -     dulaglutide (TRULICITY) 4.5 mg/0.34mL pen; Inject 0.5 mLs (4.5 mg total) into the skin every 7 days  - continue Invokana 300 mg and metformin 2000 mg.     Essential hypertension  - BP is at goal off of  medications     Hyperlipidemia, unspecified hyperlipidemia type  - continue statin.     Depression, unspecified depression type/Anxiety  - mood has been very well controlled on wellbutrin and celexa  - following with therapist PRN.     Other orders  -     POCT HEMOGLOBIN A1C    Handouts Given: Patient educational materials distributed by print-out and/or inserted into AVS   Follow-up:   RTC:  3 months for repeat A1c.     Author:   Raiford Noble, MD   General Medicine Division  [facility] [phone]  07/23/2022 8:06 AM            Additional Objective Data Listed Below.    Review of Systems:     12 point ROS negative except for HPI above.     Medications/Allergies/Immunizations:     Medication list reviewed and reconciled this visit in the EMR. Allergy list confirmed in the EMR.    Current Outpatient Medications   Medication Sig    citalopram (CELEXA) 40 mg  tablet TAKE 1 TABLET BY MOUTH EVERY DAY    INVOKANA 300 MG tablet TAKE 1 TABLET BY MOUTH EVERY DAY BEFORE BREAKFAST    buPROPion (WELLBUTRIN XL) 300 mg 24 hr tablet Take 1 tablet (300 mg total) by mouth every morning  SWALLOW WHOLE, DO NOT BREAK,CRUSH OR CHEW    atorvastatin (LIPITOR) 20 mg tablet TAKE 1 TABLET BY MOUTH EVERY DAY    metFORMIN (GLUCOPHAGE-XR) 500 mg 24 hr tablet Take 4 tablets (2,000 mg total) by mouth daily (with dinner)  Swallow whole. Do not crush, break, or chew.    hydrOXYzine HCl (ATARAX) 25 MG tablet Take 0.5-1 tablets (12.5-25 mg total) by mouth at bedtime    insulin pen needle (B-D UF III MINI PEN NEEDLES) 31G X 5 MM USE AS DIRECTED    alcohol swabs pads Use as directed    BAYER CONTOUR test strip tes 2-3 times daily as needed for 250.02    LANCETS ULTRA THIN MISC Test 3 times daily for 250.02    dulaglutide (TRULICITY) 4.5 mg/0.25mL pen Inject 0.5 mLs (4.5 mg total) into the skin every 7 days         Past Medical History/Family History/Social History:     Past Medical History:   Diagnosis Date    Anxiety     Diabetes mellitus     HLD (hyperlipidemia)         Past Medical History, Family History, and Social History reviewed, confirmed, and updated as appropriate this visit in the EMR.     Labs and Imaging:   Labs and Imaging results reviewed with patient.

## 2022-09-17 ENCOUNTER — Other Ambulatory Visit: Payer: Self-pay | Admitting: Internal Medicine

## 2022-09-17 DIAGNOSIS — E119 Type 2 diabetes mellitus without complications: Secondary | ICD-10-CM

## 2022-09-17 NOTE — Telephone Encounter (Signed)
Dr. Al Corpus unavailable, refill request routed to Dr. Peter Minium 09/17/2022 8:15 AM

## 2022-10-08 ENCOUNTER — Encounter: Payer: Self-pay | Admitting: Internal Medicine

## 2022-10-09 ENCOUNTER — Other Ambulatory Visit: Payer: Self-pay

## 2022-10-09 MED ORDER — EMPAGLIFLOZIN 25 MG PO TABS *I*
25.0000 mg | ORAL_TABLET | Freq: Every morning | ORAL | 3 refills | Status: DC
Start: 2022-10-09 — End: 2023-01-20

## 2022-10-09 NOTE — Telephone Encounter (Signed)
Changed from invokana to jardiance due to letter pt received from insurance company with invokana being tier 3.     Will start jardiance 25mg  as was on invokana 300mg .     Sherrian Divers, DO

## 2022-10-17 ENCOUNTER — Telehealth: Payer: Self-pay | Admitting: Internal Medicine

## 2022-10-17 NOTE — Telephone Encounter (Addendum)
Writer called patient in regards to rescheduling an appointment with Dr. Lianne Bushy.    Writer left a voicemail for patient to call the office back to get rescheduled    Writer canceled appointment    Writer sent mychart      If patient calls back, please schedule with  team NP or resident provider, or Dr. Lianne Bushy next available.

## 2022-10-27 ENCOUNTER — Ambulatory Visit: Payer: BLUE CROSS/BLUE SHIELD | Admitting: Internal Medicine

## 2022-11-27 ENCOUNTER — Other Ambulatory Visit: Payer: Self-pay | Admitting: Internal Medicine

## 2022-11-27 ENCOUNTER — Encounter: Payer: Self-pay | Admitting: Student in an Organized Health Care Education/Training Program

## 2022-11-27 ENCOUNTER — Ambulatory Visit: Payer: BLUE CROSS/BLUE SHIELD | Admitting: Internal Medicine

## 2022-11-27 DIAGNOSIS — F419 Anxiety disorder, unspecified: Secondary | ICD-10-CM

## 2022-11-27 NOTE — Telephone Encounter (Signed)
Mychart messaged pt about wellbutrin dose. Awaiting response.     Sherrian Divers, DO

## 2022-11-27 NOTE — Telephone Encounter (Signed)
Dr. Al Corpus unavailable. Refill request routed to today's grouper Dr. Lenis Noon for processing 11/27/2022 11:44 AM    If appropriate, please refill the script for 90 days.

## 2022-12-01 ENCOUNTER — Other Ambulatory Visit: Payer: Self-pay | Admitting: Internal Medicine

## 2022-12-01 DIAGNOSIS — F419 Anxiety disorder, unspecified: Secondary | ICD-10-CM

## 2022-12-01 MED ORDER — BUPROPION HCL 300 MG PO TB24 *I*
300.0000 mg | ORAL_TABLET | Freq: Every morning | ORAL | 3 refills | Status: DC
Start: 2022-12-01 — End: 2024-01-04

## 2022-12-01 NOTE — Telephone Encounter (Signed)
Dr. Al Corpus unavailable, refill request routed to Dr. Noralyn Pick 12/01/2022 3:57 PM

## 2022-12-07 ENCOUNTER — Other Ambulatory Visit: Payer: Self-pay | Admitting: Internal Medicine

## 2022-12-07 DIAGNOSIS — F419 Anxiety disorder, unspecified: Secondary | ICD-10-CM

## 2022-12-08 NOTE — Telephone Encounter (Signed)
Medication request routed to Dr Al Corpus for processing 12/08/2022 9:54 AM

## 2022-12-29 ENCOUNTER — Encounter: Payer: Self-pay | Admitting: Optometrist

## 2022-12-29 ENCOUNTER — Other Ambulatory Visit: Payer: Self-pay

## 2022-12-29 ENCOUNTER — Ambulatory Visit: Payer: BLUE CROSS/BLUE SHIELD | Admitting: Optometrist

## 2022-12-29 DIAGNOSIS — H52221 Regular astigmatism, right eye: Secondary | ICD-10-CM

## 2022-12-29 DIAGNOSIS — E119 Type 2 diabetes mellitus without complications: Secondary | ICD-10-CM

## 2022-12-29 DIAGNOSIS — H524 Presbyopia: Secondary | ICD-10-CM

## 2022-12-29 DIAGNOSIS — H26492 Other secondary cataract, left eye: Secondary | ICD-10-CM

## 2022-12-29 NOTE — Progress Notes (Signed)
Assessment:  DM type II without retinopathy or DME OU   -onset ~2005  -last A1c 7.8% >1 year prior  2. Pseudophakia with early PCO OS (off-axis)   -pt asymptomatic  3. Myopia/astigmatism OD with presbyopia OU.   -pt remains comfortable sc    Plan:  Proper glycemic and hypertensive control advised. Pt educated on importance of yearly dilated exams. Updated labs ordered.  2,3. Pt ed on refractive findings; no Rx indicated at this time; repeat refraction prn.    RTC 1 year for DM eye exam, sooner prn.

## 2022-12-30 ENCOUNTER — Other Ambulatory Visit: Payer: Self-pay | Admitting: Internal Medicine

## 2022-12-30 NOTE — Telephone Encounter (Signed)
Medication request routed to Dr. Al Corpus for processing 12/30/2022 8:00 AM

## 2023-01-04 ENCOUNTER — Other Ambulatory Visit: Payer: Self-pay | Admitting: Internal Medicine

## 2023-01-04 DIAGNOSIS — E119 Type 2 diabetes mellitus without complications: Secondary | ICD-10-CM

## 2023-01-05 MED ORDER — METFORMIN HCL 500 MG PO TB24 *I*
2000.0000 mg | ORAL_TABLET | Freq: Every day | ORAL | 3 refills | Status: DC
Start: 2023-01-05 — End: 2023-12-18

## 2023-01-05 NOTE — Telephone Encounter (Signed)
Medication request routed to Dr.Mahler for processing 01/05/2023 8:14 AM    If appropriate, please refill the script for 90 days.

## 2023-01-19 ENCOUNTER — Other Ambulatory Visit: Payer: Self-pay | Admitting: Student in an Organized Health Care Education/Training Program

## 2023-01-20 NOTE — Telephone Encounter (Signed)
Medication request routed to Dr.Mahler for processing 01/20/2023 8:38 AM    If appropriate, please refill the script for 90 days.

## 2023-02-02 ENCOUNTER — Other Ambulatory Visit: Payer: Self-pay | Admitting: Internal Medicine

## 2023-02-02 ENCOUNTER — Encounter: Payer: Self-pay | Admitting: Internal Medicine

## 2023-02-02 DIAGNOSIS — E119 Type 2 diabetes mellitus without complications: Secondary | ICD-10-CM

## 2023-02-03 MED ORDER — DULAGLUTIDE 4.5 MG/0.5ML SC SOAJ *I*
4.5000 mg | SUBCUTANEOUS | 3 refills | Status: DC
Start: 2023-02-03 — End: 2023-02-09

## 2023-02-03 NOTE — Telephone Encounter (Signed)
Dr. Al Corpus unavailable, refill request routed to Dr. Peter Minium 02/03/2023 8:04 AM

## 2023-02-09 MED ORDER — SEMAGLUTIDE (1 MG/DOSE) 4 MG/3ML SC SOPN *I*
1.0000 mg | PEN_INJECTOR | SUBCUTANEOUS | 2 refills | Status: DC
Start: 2023-02-09 — End: 2023-03-09

## 2023-02-19 ENCOUNTER — Other Ambulatory Visit
Admission: RE | Admit: 2023-02-19 | Discharge: 2023-02-19 | Disposition: A | Discharge status: Home / Self Care | Payer: BLUE CROSS/BLUE SHIELD | Source: Ambulatory Visit | Attending: Optometrist | Admitting: Optometrist

## 2023-02-19 DIAGNOSIS — E119 Type 2 diabetes mellitus without complications: Secondary | ICD-10-CM | POA: Insufficient documentation

## 2023-02-19 LAB — COMPREHENSIVE METABOLIC PANEL
ALT: 30 U/L (ref 0–50)
AST: 26 U/L (ref 0–50)
Albumin: 4.8 g/dL (ref 3.5–5.2)
Alk Phos: 40 U/L (ref 40–130)
Anion Gap: 13 (ref 7–16)
Bilirubin,Total: 0.4 mg/dL (ref 0.0–1.2)
CO2: 26 mmol/L (ref 20–28)
Calcium: 9.9 mg/dL (ref 8.6–10.2)
Chloride: 100 mmol/L (ref 96–108)
Creatinine: 0.87 mg/dL (ref 0.67–1.17)
Glucose: 145 mg/dL — ABNORMAL HIGH (ref 60–99)
Lab: 15 mg/dL (ref 6–20)
Potassium: 4.1 mmol/L (ref 3.3–5.1)
Sodium: 139 mmol/L (ref 133–145)
Total Protein: 6.7 g/dL (ref 6.3–7.7)
eGFR BY CREAT: 106 *

## 2023-02-19 LAB — CBC AND DIFFERENTIAL
Baso # K/uL: 0.1 10*3/uL (ref 0.0–0.2)
Eos # K/uL: 0.2 10*3/uL (ref 0.0–0.5)
Hematocrit: 43 % (ref 37–52)
Hemoglobin: 14.4 g/dL (ref 12.0–17.0)
IMM Granulocytes #: 0 10*3/uL (ref 0.0–0.0)
IMM Granulocytes: 0.4 %
Lymph # K/uL: 2.6 10*3/uL (ref 1.0–5.0)
MCV: 95 fL (ref 75–100)
Mono # K/uL: 0.8 10*3/uL (ref 0.1–1.0)
Neut # K/uL: 4.1 10*3/uL (ref 1.5–6.5)
Nucl RBC # K/uL: 0 10*3/uL (ref 0.0–0.0)
Nucl RBC %: 0 /100 WBC (ref 0.0–0.2)
Platelets: 222 10*3/uL (ref 150–450)
RBC: 4.6 MIL/uL (ref 4.0–6.0)
RDW: 12.5 % (ref 0.0–15.0)
Seg Neut %: 52.5 %
WBC: 7.8 10*3/uL (ref 3.5–11.0)

## 2023-02-19 LAB — LIPID PANEL
Chol/HDL Ratio: 3.2
Cholesterol: 130 mg/dL
HDL: 41 mg/dL (ref 40–60)
LDL Calculated: 61 mg/dL
Non HDL Cholesterol: 89 mg/dL
Triglycerides: 138 mg/dL

## 2023-02-19 LAB — HEMOGLOBIN A1C: Hemoglobin A1C: 7.8 % — ABNORMAL HIGH

## 2023-03-07 ENCOUNTER — Encounter: Payer: Self-pay | Admitting: Internal Medicine

## 2023-03-07 DIAGNOSIS — E119 Type 2 diabetes mellitus without complications: Secondary | ICD-10-CM

## 2023-03-09 MED ORDER — SEMAGLUTIDE (1 MG/DOSE) 4 MG/3ML SC SOPN *I*
1.0000 mg | PEN_INJECTOR | SUBCUTANEOUS | 2 refills | Status: DC
Start: 2023-03-09 — End: 2023-07-20

## 2023-03-09 NOTE — Telephone Encounter (Signed)
Refill request routed to Dr. Al Corpus 03/09/2023 9:24 AM

## 2023-07-06 ENCOUNTER — Other Ambulatory Visit: Payer: Self-pay | Admitting: Internal Medicine

## 2023-07-06 DIAGNOSIS — F332 Major depressive disorder, recurrent severe without psychotic features: Secondary | ICD-10-CM

## 2023-07-06 DIAGNOSIS — F411 Generalized anxiety disorder: Secondary | ICD-10-CM

## 2023-07-06 NOTE — Telephone Encounter (Signed)
Dr. Al Corpus unavailable, refill request routed to Dr. Noralyn Pick 07/06/2023 8:42 AM

## 2023-07-19 ENCOUNTER — Other Ambulatory Visit: Payer: Self-pay | Admitting: Internal Medicine

## 2023-07-19 DIAGNOSIS — E119 Type 2 diabetes mellitus without complications: Secondary | ICD-10-CM

## 2023-07-20 NOTE — Telephone Encounter (Signed)
Dr. Al Corpus unavailable, refill request routed to Dr. Peter Minium 07/20/2023 8:26 AM

## 2023-10-02 ENCOUNTER — Encounter: Payer: Self-pay | Admitting: Internal Medicine

## 2023-10-02 DIAGNOSIS — I10 Essential (primary) hypertension: Secondary | ICD-10-CM

## 2023-10-02 DIAGNOSIS — E119 Type 2 diabetes mellitus without complications: Secondary | ICD-10-CM

## 2023-10-07 ENCOUNTER — Other Ambulatory Visit: Payer: Self-pay | Admitting: Internal Medicine

## 2023-10-07 DIAGNOSIS — E119 Type 2 diabetes mellitus without complications: Secondary | ICD-10-CM

## 2023-10-07 NOTE — Telephone Encounter (Signed)
Refill request routed to Dr. Al Corpus  10/07/2023 9:17 AM

## 2023-10-19 ENCOUNTER — Encounter: Payer: Self-pay | Admitting: Internal Medicine

## 2023-10-29 ENCOUNTER — Ambulatory Visit: Payer: BLUE CROSS/BLUE SHIELD | Admitting: Internal Medicine

## 2023-11-27 ENCOUNTER — Other Ambulatory Visit: Payer: Self-pay | Admitting: Internal Medicine

## 2023-11-27 DIAGNOSIS — F419 Anxiety disorder, unspecified: Secondary | ICD-10-CM

## 2023-11-27 NOTE — Telephone Encounter (Signed)
 Refill request routed to Dr. Al Corpus 11/27/2023 8:30 AM

## 2023-12-08 ENCOUNTER — Other Ambulatory Visit
Admission: RE | Admit: 2023-12-08 | Discharge: 2023-12-08 | Disposition: A | Discharge status: Home / Self Care | Source: Ambulatory Visit | Attending: Internal Medicine | Admitting: Internal Medicine

## 2023-12-08 ENCOUNTER — Other Ambulatory Visit: Payer: Self-pay

## 2023-12-08 DIAGNOSIS — E119 Type 2 diabetes mellitus without complications: Secondary | ICD-10-CM | POA: Insufficient documentation

## 2023-12-08 LAB — COMPREHENSIVE METABOLIC PANEL
ALT: 21 U/L (ref 0–50)
AST: 27 U/L (ref 0–50)
Albumin: 4.5 g/dL (ref 3.5–5.2)
Alk Phos: 47 U/L (ref 40–130)
Anion Gap: 12 (ref 7–16)
Bilirubin,Total: 0.3 mg/dL (ref 0.0–1.2)
CO2: 24 mmol/L (ref 20–28)
Calcium: 9.5 mg/dL (ref 8.6–10.2)
Chloride: 103 mmol/L (ref 96–108)
Creatinine: 0.77 mg/dL (ref 0.67–1.17)
Glucose: 170 mg/dL — ABNORMAL HIGH (ref 60–99)
Lab: 15 mg/dL (ref 6–20)
Potassium: 4.4 mmol/L (ref 3.3–5.1)
Sodium: 139 mmol/L (ref 133–145)
Total Protein: 6.7 g/dL (ref 6.3–7.7)
eGFR BY CREAT: 109 *

## 2023-12-08 LAB — LIPID PANEL
Chol/HDL Ratio: 3.4
Cholesterol: 147 mg/dL
HDL: 43 mg/dL (ref 40–60)
LDL Calculated: 73 mg/dL
Non HDL Cholesterol: 104 mg/dL
Triglycerides: 187 mg/dL — AB

## 2023-12-08 LAB — MICROALBUMIN, URINE, RANDOM
Creatinine,UR: 46 mg/dL (ref 20–300)
Microalbumin,UR: <1.2 mg/dL

## 2023-12-09 ENCOUNTER — Encounter: Payer: Self-pay | Admitting: Internal Medicine

## 2023-12-09 ENCOUNTER — Ambulatory Visit: Admitting: Internal Medicine

## 2023-12-09 VITALS — BP 124/83 | HR 88 | Temp 96.5°F | Wt 181.8 lb

## 2023-12-09 DIAGNOSIS — E785 Hyperlipidemia, unspecified: Secondary | ICD-10-CM

## 2023-12-09 DIAGNOSIS — F32A Depression, unspecified: Secondary | ICD-10-CM

## 2023-12-09 DIAGNOSIS — F419 Anxiety disorder, unspecified: Secondary | ICD-10-CM

## 2023-12-09 DIAGNOSIS — E119 Type 2 diabetes mellitus without complications: Secondary | ICD-10-CM

## 2023-12-09 DIAGNOSIS — I10 Essential (primary) hypertension: Secondary | ICD-10-CM

## 2023-12-09 LAB — HEMOGLOBIN A1C: Hemoglobin A1C: 7.5 % — ABNORMAL HIGH

## 2023-12-09 MED ORDER — SEMAGLUTIDE (2 MG/DOSE) 8 MG/3ML SC SOPN *I*
2.0000 mg | PEN_INJECTOR | SUBCUTANEOUS | 2 refills | Status: DC
Start: 2023-12-09 — End: 2024-02-22

## 2023-12-09 NOTE — Progress Notes (Signed)
 [facility] Follow-up Note     ReasonReason For Visit:   Follow up     HPI:      Dale Schultz is 48 y.o. year old male coming in to discuss the following issues:    History of Present Illness  The patient presents for evaluation of diabetes, carpal tunnel syndrome, and back pain.    Diabetes  - Management has shown improvement, with blood glucose levels occasionally dropping to 100.  - Positive change attributed to weight loss and medication regimen.  - Current weight is approximately 180 pounds.  - No side effects from diabetes medications are reported.  - Current A1c level is unknown.  - Appetite has decreased, leading to reduced food intake.  - Jardiance , metformin , and Ozempic  1 mg are taken without adverse effects.    Carpal Tunnel Syndrome  - Numbness in the hands began over the summer and has been exacerbated by activities such as painting.  - No weakness in the hands is reported.  - Tingling sensations occur upon waking.  - Numbness is generalized across the entire hand.    Back Pain  - Approximately a month ago, a fall down the stairs at his brother's house resulted in back pain.  - Pain is sharp and localized to one side, occurring when moving incorrectly.  - No spinal pain is reported.  - Discomfort is experienced when lying down or turning.  - Pain had improved but recurred about a week ago.  - Area is described as feeling like a muscle knot.  - Lidocaine patches and Advil have been used for pain management.    Supplemental information: Mental health is stable with continued use of citalopram  and bupropion . He is still seeing Concha Deed and feels okay mentally. No feelings of depression are reported, but some anxiety is experienced, though it is not as severe as it was when he was at [employer].    SOCIAL HISTORY  - Currently working as a Runner, broadcasting/film/video    FAMILY HISTORY  - Oldest daughter diagnosed with type 1 diabetes in November  - Wife's mother diagnosed with multiple myeloma  - Father  undergoing evaluation for bladder issues      Physical Exam:     Vitals:    12/09/23 0938   BP: 124/83   Pulse: 88   Temp: 35.8 C (96.5 F)   SpO2: 98%   Weight: 82.5 kg (181 lb 12.8 oz)       Vitals Signs: ZOX:WRUE mass index is 26.09 kg/m., otherwise as above, reviewed with patient    Physical Exam  Respiratory: Clear to auscultation, no wheezing, rales or rhonchi  Cardiovascular: Regular rate and rhythm, no murmurs, rubs, or gallops  Musculoskeletal: No pain along the spine, tenderness in the lower thoracic region, likely intercostal muscle irritation      Results  - Labs:    - A1c: 12/09/2023, 7.5    - Triglycerides: 187 mg/dL          Assessment and Plan:     Diagnoses and all orders for this visit:    Type 2 diabetes mellitus without complication, without long-term current use of insulin   -     semaglutide , 2 mg/dose, (OZEMPIC ) 8 MG/3ML pen; Inject 2 mg into the skin once a week.    Essential hypertension    Hyperlipidemia, unspecified hyperlipidemia type    Depression, unspecified depression type    Anxiety        Assessment & Plan  1. Diabetes mellitus: Stable. A1c 7.5 on 05/2022, previously 8 on 01/2022.  - Increase Ozempic to 2 mg per week after completing current supply of 1 mg.  - Re-evaluate A1c in 3 months.  - Monitor for gastrointestinal side effects such as nausea and constipation.    Current Diabetes Medications              semaglutide, 2 mg/dose, (OZEMPIC) 8 MG/3ML pen Inject 2 mg into the skin once a week.    empagliflozin (JARDIANCE) 25 mg tablet TAKE 1 TABLET BY MOUTH EVERY MORNING    metFORMIN (GLUCOPHAGE-XR) 500 mg 24 hr tablet Take 4 tablets (2,000 mg total) by mouth daily (with breakfast). Swallow whole. Do not crush, break, or chew.            2. Carpal tunnel syndrome.  - Utilize wrist brace during nighttime and daytime activities if possible.  - Consider nerve conduction study if symptoms persist despite night braces.    3. Back pain.  - Likely due to irritation of intercostal  muscles.  - Continue treatment with lidocaine patches and Advil.  - Consider imaging studies if condition worsens or fails to improve within the next few weeks.    4. Major depressive disorder: Stable.  - Continue citalopram and bupropion.  - Continue therapy with Raynelle Fanning.    Follow-up  - Follow up in 3 months.        Author:   Raiford Noble, MD   General Medicine Division  [facility] [phone]  12/09/2023 10:07 AM        I personally spent 35 minutes on the calendar day of the encounter, including pre and post visit work.        Additional Objective Data Listed Below.    Review of Systems:     12 point ROS negative except for HPI above.     Medications/Allergies/Immunizations:     Medication list reviewed and reconciled this visit in the EMR. Allergy list confirmed in the EMR.    Current Outpatient Medications   Medication Sig    semaglutide, 2 mg/dose, (OZEMPIC) 8 MG/3ML pen Inject 2 mg into the skin once a week.    buPROPion (WELLBUTRIN XL) 150 mg 24 hr tablet TAKE 1 TABLET BY MOUTH EVERY MORNING SWALLOW WHOLE, DO NOT BREAK, CRUSH OR CHEW    citalopram (CELEXA) 40 mg tablet TAKE 1 TABLET BY MOUTH EVERY DAY    empagliflozin (JARDIANCE) 25 mg tablet TAKE 1 TABLET BY MOUTH EVERY MORNING    metFORMIN (GLUCOPHAGE-XR) 500 mg 24 hr tablet Take 4 tablets (2,000 mg total) by mouth daily (with breakfast). Swallow whole. Do not crush, break, or chew.    atorvastatin (LIPITOR) 20 mg tablet TAKE 1 TABLET BY MOUTH EVERY DAY    buPROPion (WELLBUTRIN XL) 300 mg 24 hr tablet Take 1 tablet (300 mg total) by mouth every morning SWALLOW WHOLE, DO NOT BREAK,CRUSH OR CHEW    hydrOXYzine HCl (ATARAX) 25 MG tablet Take 0.5-1 tablets (12.5-25 mg total) by mouth at bedtime    insulin pen needle (B-D UF III MINI PEN NEEDLES) 31G X 5 MM USE AS DIRECTED    alcohol swabs pads Use as directed    BAYER CONTOUR test strip tes 2-3 times daily as needed for 250.02    LANCETS ULTRA THIN MISC Test 3 times daily for 250.02         Past Medical History/Family  History/Social History:     Past Medical History:   Diagnosis Date  Anxiety     Diabetes mellitus     HLD (hyperlipidemia)        Past Medical History, Family History, and Social History reviewed, confirmed, and updated as appropriate this visit in the EMR.     Labs and Imaging:   Labs and Imaging results reviewed with patient.

## 2023-12-18 ENCOUNTER — Other Ambulatory Visit: Payer: Self-pay | Admitting: Internal Medicine

## 2023-12-18 DIAGNOSIS — E119 Type 2 diabetes mellitus without complications: Secondary | ICD-10-CM

## 2023-12-18 NOTE — Telephone Encounter (Signed)
 Refill request routed to Dr. Essie Hefty  12/18/2023 9:41 AM

## 2024-01-03 ENCOUNTER — Other Ambulatory Visit: Payer: Self-pay | Admitting: Internal Medicine

## 2024-01-03 DIAGNOSIS — F419 Anxiety disorder, unspecified: Secondary | ICD-10-CM

## 2024-01-04 ENCOUNTER — Ambulatory Visit: Payer: BLUE CROSS/BLUE SHIELD | Admitting: Optometrist

## 2024-01-04 NOTE — Telephone Encounter (Signed)
 Refill request routed to Dr. Essie Hefty 01/04/2024 8:58 AM

## 2024-01-19 ENCOUNTER — Other Ambulatory Visit: Payer: Self-pay

## 2024-01-20 ENCOUNTER — Ambulatory Visit: Admitting: Optometrist

## 2024-01-20 DIAGNOSIS — E119 Type 2 diabetes mellitus without complications: Secondary | ICD-10-CM

## 2024-01-20 DIAGNOSIS — H524 Presbyopia: Secondary | ICD-10-CM

## 2024-01-20 DIAGNOSIS — H26492 Other secondary cataract, left eye: Secondary | ICD-10-CM

## 2024-01-20 MED ORDER — LOTEPREDNOL ETABONATE 0.5 % OP SUSP *I*
1.0000 [drp] | Freq: Three times a day (TID) | OPHTHALMIC | 0 refills | Status: AC
Start: 2024-01-20 — End: 2024-01-25

## 2024-01-20 NOTE — Progress Notes (Signed)
 Assessment:  DM type II without retinopathy or DME OU   -onset ~2005  -last A1c 7.3%, per pt  2. Pseudophakia with early PCO OS (off-axis)   -pt asymptomatic  3. Myopia/astigmatism with presbyopia OU.   -pt remains comfortable sc    Plan:  Proper glycemic and hypertensive control advised. Pt educated on importance of yearly dilated exams.   2,3. Pt ed on refractive findings; no Rx indicated at this time; repeat refraction prn.    RTC 1 year for DM eye exam, sooner prn.

## 2024-02-16 ENCOUNTER — Ambulatory Visit: Attending: Student in an Organized Health Care Education/Training Program

## 2024-02-16 ENCOUNTER — Telehealth: Payer: Self-pay | Admitting: Internal Medicine

## 2024-02-16 ENCOUNTER — Other Ambulatory Visit: Payer: Self-pay

## 2024-02-16 ENCOUNTER — Encounter: Payer: Self-pay | Admitting: Internal Medicine

## 2024-02-16 VITALS — BP 115/81 | HR 73 | Temp 96.4°F | Wt 173.2 lb

## 2024-02-16 DIAGNOSIS — M7918 Myalgia, other site: Secondary | ICD-10-CM

## 2024-02-16 DIAGNOSIS — E119 Type 2 diabetes mellitus without complications: Secondary | ICD-10-CM

## 2024-02-16 DIAGNOSIS — E785 Hyperlipidemia, unspecified: Secondary | ICD-10-CM

## 2024-02-16 MED ORDER — CYCLOBENZAPRINE HCL 5 MG PO TABS *I*
5.0000 mg | ORAL_TABLET | Freq: Three times a day (TID) | ORAL | 0 refills | Status: AC | PRN
Start: 2024-02-16 — End: ?

## 2024-02-16 MED ORDER — MELOXICAM 15 MG PO TABS *I*
15.0000 mg | ORAL_TABLET | Freq: Every day | ORAL | 0 refills | Status: AC
Start: 2024-02-16 — End: ?

## 2024-02-16 NOTE — Telephone Encounter (Signed)
 Copied from CRM #[id number]. Topic: Appointments - Schedule Appointment  >> Feb 16, 2024  9:34 AM [name] wrote:  Patient, Dale Schultz calling to report that he is experiencing sever back pain.     These symptoms have been going on for week week(s) that started last Wednesday.     Patient requesting same day appointment? Yes asking to be seen asap today due to pain is a 7/8 out of 10 and the pain feels like it's progressively getting worst.     Writer first available wasn't until Thursday which the patient is too far out.  Patient requesting call back? yes    Phone number confirmed at [phone]

## 2024-02-16 NOTE — Telephone Encounter (Signed)
 Outgoing call to [facility] regarding back pain for one week. Endorsing worsening throbbing in lower back pain for one week. Denies weakness, numbness or tingling. Writer advised pt to schedule an appointment for further assessment.    Appointment scheduled for 6/24 at 1:00 with Dr. Maree. Patient verbalized understanding and is agreeable with plan and date and time of appointment. Patient will provide own transportation.   Niels Essex, RN

## 2024-02-16 NOTE — Progress Notes (Signed)
 AMBULATORY PROGRESS NOTE        HPI/ROS:     Patient is a 48 year old male with past medical history of type 2 diabetes who presents for an acute visit for back pain.  Patient reports back pain started on June 18 and has been going on and off since then.  Patient describes pain as a dull ache but is in the left mid to upper back region.  Patient does report history of nephrolithiasis.  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  Patient denies any significant heavy lifting or exercise that could have strained the muscle.  Patient did have back pain earlier in the year that resolved, but this pain was on the underside of the back.  Patient has been taking Tylenol Advil as needed with mild improvement in the pain.  Patient denies dysuria or polyuria.        Past Medical History: has Irritable Bowel Syndrome; Nephrolithiasis; Hyperlipidemia; Diabetes mellitus; Recurrent acute sinusitis; Anxiety; and Elevated transaminase level on their problem list.      Medications:   Current Outpatient Medications   Medication Sig    JARDIANCE  25 MG tablet TAKE 1 TABLET BY MOUTH EVERY MORNING    buPROPion  (WELLBUTRIN  XL) 300 mg 24 hr tablet TAKE 1 TABLET BY MOUTH EVERY MORNING - SWALLOW WHOLE, DO NOT BREAK, CRUSH OR CHEW    metFORMIN  (GLUCOPHAGE -XR) 500 mg 24 hr tablet TAKE 4 TABLETS BY MOUTH EVERY DAY WITH BREAKFAST SWALLOW WHOLE, DO NOT BREAK, CRUSH OR CHEW    semaglutide , 2 mg/dose, (OZEMPIC ) 8 MG/3ML pen Inject 2 mg into the skin once a week.    buPROPion  (WELLBUTRIN  XL) 150 mg 24 hr tablet TAKE 1 TABLET BY MOUTH EVERY MORNING SWALLOW WHOLE, DO NOT BREAK, CRUSH OR CHEW    citalopram  (CELEXA ) 40 mg tablet TAKE 1 TABLET BY MOUTH EVERY DAY    atorvastatin  (LIPITOR) 20 mg tablet TAKE 1 TABLET BY MOUTH EVERY DAY    hydrOXYzine  HCl (ATARAX ) 25 MG tablet Take 0.5-1 tablets (12.5-25 mg total) by mouth at bedtime    insulin  pen needle (B-D UF III MINI PEN  NEEDLES) 31G X 5 MM USE AS DIRECTED    alcohol  swabs  pads Use as directed    BAYER CONTOUR test strip tes 2-3 times daily as needed for 250.02    LANCETS ULTRA THIN MISC Test 3 times daily for 250.02     No current facility-administered medications for this visit.        Allergies:   Allergies   Allergen Reactions    Penicillins Hives     Created by Conversion - 0;        OBJECTIVE:      Vitals:  There were no vitals filed for this visit.    BP Readings from Last 3 Encounters:   12/09/23 124/83   07/23/22 133/78   04/21/22 127/75       Wt Readings from Last 3 Encounters:   12/09/23 82.5 kg (181 lb 12.8 oz)   07/23/22 86.1 kg (189 lb 12.8 oz)   04/21/22 89.8 kg (198 lb)         PHYSICAL EXAM:  General: Alert, no acute distress  Respiratory:  Respirations non-labored, Symmetrical chest wall expansion  Cardiovascular: Normal rate  Gastrointestinal: Soft, Non-tender, Non-distended  Musculoskeletal: Normal strength, No swelling, mild pain on palpation in between L intercostal muscles   Integumentary: Warm, Dry,  pink  Neurologic: Alert and oriented, No focal deficits  Psychiatric: Cooperative, Appropriate mood & affect      Labs:  No results found for this or any previous visit (from the past 24 hours).      CBC      Lab results: 02/19/23  1246   WBC 7.8   Hemoglobin 14.4   Hematocrit 43   RBC 4.6   Platelets 222         CHEMISTRY    Chemistry        Lab results: 12/08/23  1248   Sodium 139   Potassium 4.4   Chloride 103   CO2 24   UN 15   Creatinine 0.77        Lab results: 12/08/23  1248   Glucose 170*   Calcium  9.5   Total Protein 6.7   Albumin 4.5   ALT 21   AST 27   Alk Phos 47   Bilirubin,Total 0.3            LIPIDS      Lab results: 12/08/23  1248   Cholesterol 147   HDL 43   LDL Calculated 73   Triglycerides 187*   Chol/HDL Ratio 3.4     No components found with this basename: NHLDC      A1C  Lab Results   Component Value Date    HA1C 7.5 (H) 12/08/2023             ASSESSMENT/PLAN:   Eesa Justiss is a 48 y.o. yo  male who presents to the office today for acute visit    #MSK strain/pain of L intercostal muscle  -start Flexeril 5mg  TID PRN for 7-14 days  -start meloxicam 15mg  daily for 7-14 days  -continue tylenol PRN, (patient advised to stop advil)  -patient to return if pain worsens acutely or fails to improve in 2-4 weeks  -other differential that seems much less likely based on location and nature of pain is nephrolithiasis, but if pain did acutely worsen would consider this and consider imaging   -continue stretches     #T2DM  -continue Jardiance , metformin , and ozempic    -diabetic foot exam at next visit     #HLD  -continue atorvastatin        Follow up: ~1 month with Dr. Maurine     Orders Placed:  No orders of the defined types were placed in this encounter.      Patient Instructions:  There are no Patient Instructions on file for this visit.        ______________________    MAREE PINK DEL, MD  Internal Medicine PGY-2  02/16/2024 11:01 AM

## 2024-02-16 NOTE — Telephone Encounter (Unsigned)
 Copied from CRM #[id number]. Topic: Return Call - Speak to Provider/Office Staff  >> Feb 16, 2024 10:29 AM Leotis GAILS wrote:  Dale Schultz, Patient, is returning a call from [place], [place] and states this is regarding back pain.  Was the appropriate expectation set with the patient for a time frame to receive a return call from the office? Yes  Patient can be reached at: [phone]

## 2024-02-16 NOTE — Addendum Note (Signed)
 Addended by: LOUVELLA CAMIE NORRIS on: 02/16/2024 08:30 PM     Modules accepted: Level of Service

## 2024-02-16 NOTE — Telephone Encounter (Signed)
Call to patient but no answer.   Left message to call office.   Call back number provided.    Keyshla Tunison, RN

## 2024-02-20 ENCOUNTER — Other Ambulatory Visit: Payer: Self-pay | Admitting: Internal Medicine

## 2024-02-20 DIAGNOSIS — E119 Type 2 diabetes mellitus without complications: Secondary | ICD-10-CM

## 2024-02-22 NOTE — Telephone Encounter (Signed)
 Dr. Maurine unavailable, refill request routed to Dr. Faye 02/22/2024 8:36 AM

## 2024-02-29 ENCOUNTER — Encounter: Payer: Self-pay | Admitting: Internal Medicine

## 2024-02-29 ENCOUNTER — Encounter: Payer: Self-pay | Admitting: Gastroenterology

## 2024-02-29 ENCOUNTER — Telehealth: Payer: Self-pay | Admitting: Internal Medicine

## 2024-02-29 DIAGNOSIS — M5416 Radiculopathy, lumbar region: Secondary | ICD-10-CM

## 2024-02-29 NOTE — Telephone Encounter (Signed)
 Recardo from [facility] calling to request a referral for PT for patient's low back pain. Fax [phone]

## 2024-03-01 NOTE — Telephone Encounter (Signed)
 Writer faxed requested PT referral to [facility] at fax number [phone] and received confirmation on 02/29/24.

## 2024-03-08 ENCOUNTER — Other Ambulatory Visit: Payer: Self-pay

## 2024-03-09 ENCOUNTER — Ambulatory Visit: Admitting: Internal Medicine

## 2024-03-09 NOTE — Progress Notes (Deleted)
 [facility] Follow-up Note     ReasonReason For Visit:   Follow up     HPI:      Dale Schultz is 48 y.o. year old male coming in to discuss the following issues:    History of Present Illness    He was referred to PT for lower back radiculopathy sxs.     T2dm- last A1c was above goal at 7.5.     HTN-     Mood-     Physical Exam:     There were no vitals filed for this visit.    Vitals Signs: AFP:Uyzmz is no height or weight on file to calculate BMI., otherwise as above, reviewed with patient    Physical Exam        Results            Assessment and Plan:     Diagnoses and all orders for this visit:    Lumbar radiculopathy    Musculoskeletal pain    Type 2 diabetes mellitus without complication, without long-term current use of insulin     Hyperlipidemia, unspecified hyperlipidemia type    Essential hypertension        Assessment & Plan          Author:   Jackquline Canes, MD   General Medicine Division  [facility]  03/09/2024 7:56 AM        {Time Based Attestation and Data Reviewed (Optional):[id number]}        Additional Objective Data Listed Below.    Review of Systems:     12 point ROS negative except for HPI above.     Medications/Allergies/Immunizations:     Medication list reviewed and reconciled this visit in the EMR. Allergy list confirmed in the EMR.    Current Outpatient Medications   Medication Sig    OZEMPIC , 2 MG/DOSE, 8 MG/3ML pen INJECT 2MG  SUBCUTANEOUSLY (UNDER THE SKIN) ONCE WEEKLY    meloxicam  (MOBIC ) 15 mg tablet Take 1 tablet (15 mg total) by mouth daily. Take with food.    cyclobenzaprine  (FLEXERIL ) 5 mg tablet Take 1 tablet (5 mg total) by mouth 3 times daily as needed for Muscle spasms.    JARDIANCE  25 MG tablet TAKE 1 TABLET BY MOUTH EVERY MORNING    buPROPion  (WELLBUTRIN  XL) 300 mg 24 hr tablet TAKE 1 TABLET BY MOUTH EVERY MORNING - SWALLOW WHOLE, DO NOT BREAK, CRUSH OR CHEW    metFORMIN  (GLUCOPHAGE -XR) 500 mg 24 hr tablet TAKE 4 TABLETS BY MOUTH EVERY DAY WITH BREAKFAST  SWALLOW WHOLE, DO NOT BREAK, CRUSH OR CHEW    buPROPion  (WELLBUTRIN  XL) 150 mg 24 hr tablet TAKE 1 TABLET BY MOUTH EVERY MORNING SWALLOW WHOLE, DO NOT BREAK, CRUSH OR CHEW    citalopram  (CELEXA ) 40 mg tablet TAKE 1 TABLET BY MOUTH EVERY DAY    atorvastatin  (LIPITOR) 20 mg tablet TAKE 1 TABLET BY MOUTH EVERY DAY    hydrOXYzine  HCl (ATARAX ) 25 MG tablet Take 0.5-1 tablets (12.5-25 mg total) by mouth at bedtime    insulin  pen needle (B-D UF III MINI PEN NEEDLES) 31G X 5 MM USE AS DIRECTED    alcohol  swabs  pads Use as directed    BAYER CONTOUR test strip tes 2-3 times daily as needed for 250.02    LANCETS ULTRA THIN MISC Test 3 times daily for 250.02         Past Medical History/Family History/Social History:     Past Medical History[1]  Past Medical History, Family History, and Social History reviewed, confirmed, and updated as appropriate this visit in the EMR.     Labs and Imaging:   Labs and Imaging results reviewed with patient.            [1]   Past Medical History:  Diagnosis Date    Anxiety     Diabetes mellitus     HLD (hyperlipidemia)

## 2024-03-18 ENCOUNTER — Other Ambulatory Visit: Payer: Self-pay | Admitting: Internal Medicine

## 2024-03-18 NOTE — Telephone Encounter (Signed)
 Refill request routed to Dr. Maurine 03/18/2024 7:59 AM

## 2024-04-03 ENCOUNTER — Other Ambulatory Visit: Payer: Self-pay

## 2024-04-04 ENCOUNTER — Encounter: Payer: Self-pay | Admitting: Internal Medicine

## 2024-04-04 ENCOUNTER — Ambulatory Visit: Attending: Internal Medicine | Admitting: Internal Medicine

## 2024-04-04 VITALS — BP 119/73 | HR 73 | Temp 97.2°F | Ht 70.0 in | Wt 177.4 lb

## 2024-04-04 DIAGNOSIS — F32A Depression, unspecified: Secondary | ICD-10-CM | POA: Insufficient documentation

## 2024-04-04 DIAGNOSIS — E119 Type 2 diabetes mellitus without complications: Secondary | ICD-10-CM | POA: Insufficient documentation

## 2024-04-04 DIAGNOSIS — F419 Anxiety disorder, unspecified: Secondary | ICD-10-CM | POA: Insufficient documentation

## 2024-04-04 DIAGNOSIS — I10 Essential (primary) hypertension: Secondary | ICD-10-CM | POA: Insufficient documentation

## 2024-04-04 DIAGNOSIS — E785 Hyperlipidemia, unspecified: Secondary | ICD-10-CM | POA: Insufficient documentation

## 2024-04-04 LAB — POCT HEMOGLOBIN A1C: Hemoglobin A1C,POC: 6.7 % — ABNORMAL HIGH (ref <=5.6)

## 2024-04-04 NOTE — Progress Notes (Signed)
 [facility] Follow-up Note ReasonReason For Visit: Follow upHPI:  Dale Schultz is 48 y.o. 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 Physical Exam: Vitals:  04/04/24 1025 BP: 119/73 BP Location: Right arm Patient Position: Sitting Cuff Size: adult Pulse: 73 Temp: 36.2 C (97.2 F) TempSrc: Temporal SpO2: 98% Weight: 80.5 kg (177 lb 6.4 oz) Height: 1.778 m (5' 10) Vitals Signs: AFP:Anib mass index is 25.45 kg/m., otherwise as above, reviewed with patientPhysical ExamCardiovascular: Blood pressure is normal.Results- Laboratory Studies:  - A1c: 6.7Assessment and Plan: Diagnoses and all orders for this visit:Type 2 diabetes mellitus without complication, without long-term current use of insulinEssential hypertensionHyperlipidemia, unspecified hyperlipidemia typeAnxietyDepression, unspecified depression typeOther orders-     POCT HEMOGLOBIN A1CAssessment & Plan1. Diabetes Mellitus: Stable. A1C has improved from 7.5 to 6.7, indicating better glycemic control. Tolerating Ozempic  well.- Continue current regimen of Ozempic , Jardiance , metformin .- Maintain current eating habits with decreased intake of  sweets and increased physical activity.Current Diabetes Medications        OZEMPIC , 2 MG/DOSE, 8 MG/3ML pen INJECT 2MG  SUBCUTANEOUSLY (UNDER THE SKIN) ONCE WEEKLY  JARDIANCE  25 MG tablet TAKE 1 TABLET BY MOUTH EVERY MORNING  metFORMIN  (GLUCOPHAGE -XR) 500 mg 24 hr tablet TAKE 4 TABLETS BY MOUTH EVERY DAY WITH BREAKFAST SWALLOW WHOLE, DO NOT BREAK, CRUSH OR CHEW  2. 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     buPROPion  (WELLBUTRIN  XL) 300 mg 24 hr tablet TAKE 1 TABLET BY MOUTH EVERY MORNING - SWALLOW WHOLE, DO NOT BREAK, CRUSH OR CHEW   buPROPion  (WELLBUTRIN  XL) 150 mg 24 hr tablet TAKE 1 TABLET BY MOUTH EVERY MORNING SWALLOW WHOLE, DO NOT BREAK, CRUSH OR CHEW  Follow-up- A follow-up visit is scheduled in 3 to 4 months.Author: Jackquline Canes, MD [facility] Medicine DivisionP[facility] [address]/06/2024 10:59 AM  I personally spent 25 minutes on the calendar day of the encounter, including pre and post visit work.Additional Objective Data Listed Below.Review of Systems: 12 point ROS negative except for HPI above. Medications/Allergies/Immunizations: Medication list reviewed and reconciled this visit in the EMR. Allergy list confirmed in the EMR.Current Outpatient Medications Medication Sig  atorvastatin  (LIPITOR) 20 mg tablet TAKE 1 TABLET BY MOUTH EVERY DAY  OZEMPIC , 2 MG/DOSE, 8 MG/3ML pen INJECT 2MG  SUBCUTANEOUSLY (UNDER THE SKIN) ONCE WEEKLY  meloxicam  (MOBIC ) 15 mg tablet Take 1 tablet (15 mg total) by mouth daily. Take with food.  cyclobenzaprine  (FLEXERIL ) 5 mg tablet Take 1  tablet (5 mg total) by mouth 3 times daily as needed for Muscle spasms.  JARDIANCE  25 MG tablet TAKE 1 TABLET BY MOUTH EVERY MORNING  buPROPion  (WELLBUTRIN  XL) 300 mg 24  hr tablet TAKE 1 TABLET BY MOUTH EVERY MORNING - SWALLOW WHOLE, DO NOT BREAK, CRUSH OR CHEW  metFORMIN  (GLUCOPHAGE -XR) 500 mg 24 hr tablet TAKE 4 TABLETS BY MOUTH EVERY DAY WITH BREAKFAST SWALLOW WHOLE, DO NOT BREAK, CRUSH OR CHEW  buPROPion  (WELLBUTRIN  XL) 150 mg 24 hr tablet TAKE 1 TABLET BY MOUTH EVERY MORNING SWALLOW WHOLE, DO NOT BREAK, CRUSH OR CHEW  citalopram  (CELEXA ) 40 mg tablet TAKE 1 TABLET BY MOUTH EVERY DAY  hydrOXYzine  HCl (ATARAX ) 25 MG tablet Take 0.5-1 tablets (12.5-25 mg total) by mouth at bedtime  insulin  pen needle (B-D UF III MINI PEN NEEDLES) 31G X 5 MM USE AS DIRECTED  alcohol  swabs  pads Use as directed  BAYER CONTOUR test strip tes 2-3 times daily as needed for 250.02  LANCETS ULTRA THIN MISC Test 3 times daily for 250.02 Past Medical History/Family History/Social History: Past Medical History[1]Past Medical History, Family History, and Social History reviewed, confirmed, and updated as appropriate this visit in the EMR. Labs and Imaging: Labs and Imaging results reviewed with patient.  [1] Past Medical History:Diagnosis Date  Anxiety   Diabetes mellitus   HLD (hyperlipidemia)

## 2024-04-12 ENCOUNTER — Encounter: Payer: Self-pay | Admitting: Internal Medicine

## 2024-04-20 ENCOUNTER — Other Ambulatory Visit: Payer: Self-pay

## 2024-04-21 ENCOUNTER — Ambulatory Visit: Admitting: Internal Medicine

## 2024-04-21 ENCOUNTER — Encounter: Payer: Self-pay | Admitting: Internal Medicine

## 2024-04-21 VITALS — BP 118/78 | HR 79 | Temp 96.5°F | Wt 174.4 lb

## 2024-04-21 DIAGNOSIS — E119 Type 2 diabetes mellitus without complications: Secondary | ICD-10-CM

## 2024-04-21 MED ORDER — SEMAGLUTIDE (1 MG/DOSE) 4 MG/3ML SC SOPN *I*: 1.0000 mg | PEN_INJECTOR | SUBCUTANEOUS | 3 refills | Status: DC

## 2024-04-21 NOTE — Progress Notes (Signed)
 [facility] Follow-up Note ReasonReason For Visit: GI distress HPI:  Dale Schultz is 48 y.o. year old male coming in to discuss the following issues:History of Present IllnessThe patient presents for evaluation of iatrogenic gastroparesis.Symptoms began in 01/2024, following a banquet where tacos were consumed.- The next day, he experienced an unusual taste in his mouth, akin to rotten eggs, and subsequently vomited.- Similar episodes have occurred a few more times.- He recalls feeling well after attending a concert but suddenly became shaky and nauseous while driving home, leading to vomiting and diarrhea.- A similar episode occurred last Monday when he was picking up his daughter from an appointment.- He also reports experiencing burps with a rotten egg taste.Ozempic - He has not taken Ozempic  for about 2 weeks due to these symptoms.- He typically administers the injections on Saturdays, either in the morning or evening, and sometimes on Sundays.- He noticed that the symptoms seemed to occur the day after taking Ozempic .Despite these issues, he continues to take his other medications without any problems.He feels well overall and has been feeling better mentally.Physical Exam: Vitals:  04/21/24 1006 BP: 118/78 Pulse: 79 Temp: 35.8 C (96.5 F) SpO2: 99% Weight: 79.1 kg (174 lb 6.4 oz) Vitals Signs: AFP:Anib mass index is 25.02 kg/m., otherwise as above, reviewed with patientPhysical ExamAppears well, NAD. Mood and affect wnl ResultsAssessment and Plan: Diagnoses and all orders for this visit:Type 2 diabetes mellitus without complication, without long-term current use of insulin -     semaglutide , 1 mg/dose, (OZEMPIC ) 4 MG/3ML pen; Inject 1 mg into the skin once a week.Assessment & Plan1. 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 - Symptoms of nausea, vomiting, and a rotten  egg taste in the mouth are consistent with gastroparesis, likely induced by Ozempic .- Reduce the dosage of Ozempic  from 2 mg to 1 mg.- Prescription refill for the 1 mg dose of Ozempic  provided.- If symptoms persist with the reduced dosage, discontinue Ozempic  and consider alternative medication such as Trulicity .Follow-up- Monitor symptoms and report if they persist with the 1 mg dosage.Author: Jackquline Canes, MD [facility] Medicine Division[facility] [address]/28/2025 10:50 AM  I personally spent 20 minutes on the calendar day of the encounter, including pre and post visit work.Additional Objective Data Listed Below.Review of Systems: 12 point ROS negative except for HPI above. Medications/Allergies/Immunizations: Medication list reviewed and reconciled this visit in the EMR. Allergy list confirmed in the EMR.Current Outpatient Medications Medication Sig  semaglutide , 1 mg/dose, (OZEMPIC ) 4 MG/3ML pen Inject 1 mg into the skin once a week.  atorvastatin  (LIPITOR) 20 mg tablet TAKE 1 TABLET BY MOUTH EVERY DAY  meloxicam  (MOBIC ) 15 mg tablet Take 1 tablet (15 mg total) by mouth daily. Take with food.  cyclobenzaprine  (FLEXERIL ) 5 mg tablet Take 1 tablet (5 mg total) by mouth 3 times daily as needed for Muscle spasms.  JARDIANCE  25 MG tablet TAKE 1 TABLET BY MOUTH EVERY MORNING  buPROPion  (WELLBUTRIN  XL) 300 mg 24 hr tablet TAKE 1 TABLET BY MOUTH EVERY MORNING - SWALLOW WHOLE, DO NOT BREAK, CRUSH OR CHEW  metFORMIN  (GLUCOPHAGE -XR) 500 mg 24 hr tablet TAKE 4 TABLETS BY MOUTH EVERY DAY WITH BREAKFAST SWALLOW WHOLE, DO NOT BREAK, CRUSH OR CHEW  buPROPion  (WELLBUTRIN  XL) 150 mg 24 hr tablet TAKE 1 TABLET BY MOUTH EVERY MORNING SWALLOW WHOLE, DO NOT BREAK, CRUSH OR CHEW  citalopram  (CELEXA ) 40 mg tablet TAKE 1 TABLET BY MOUTH EVERY DAY  hydrOXYzine  HCl (ATARAX ) 25 MG tablet Take 0.5-1 tablets (12.5-25 mg total) by mouth at bedtime  insulin   pen needle  (B-D UF III MINI PEN NEEDLES) 31G X 5 MM USE AS DIRECTED  alcohol  swabs  pads Use as directed  BAYER CONTOUR test strip tes 2-3 times daily as needed for 250.02  LANCETS ULTRA THIN MISC Test 3 times daily for 250.02 Past Medical History/Family History/Social History: Past Medical History[1]Past Medical History, Family History, and Social History reviewed, confirmed, and updated as appropriate this visit in the EMR. Labs and Imaging: Labs and Imaging results reviewed with patient.  [1] Past Medical History:Diagnosis Date  Anxiety   Diabetes mellitus   HLD (hyperlipidemia)

## 2024-05-16 ENCOUNTER — Encounter: Payer: Self-pay | Admitting: Internal Medicine

## 2024-05-18 MED ORDER — DULAGLUTIDE 0.75 MG/0.5ML SC SOAJ *I*
0.7500 mg | SUBCUTANEOUS | 0 refills | Status: DC
Start: 2024-05-18 — End: 2024-06-01

## 2024-06-01 ENCOUNTER — Other Ambulatory Visit: Payer: Self-pay | Admitting: Internal Medicine

## 2024-06-01 NOTE — Telephone Encounter (Signed)
 Refill request routed to Dr. Maurine 06/01/2024 8:13 AM

## 2024-06-27 ENCOUNTER — Other Ambulatory Visit
Admission: RE | Admit: 2024-06-27 | Discharge: 2024-06-27 | Disposition: A | Discharge status: Home / Self Care | Source: Ambulatory Visit | Attending: Internal Medicine | Admitting: Internal Medicine

## 2024-06-27 ENCOUNTER — Encounter: Payer: Self-pay | Admitting: Internal Medicine

## 2024-06-27 DIAGNOSIS — R351 Nocturia: Secondary | ICD-10-CM

## 2024-06-27 LAB — CBC AND DIFFERENTIAL
Baso # K/uL: 0.1 THOU/uL (ref 0.0–0.2)
Eos # K/uL: 0.3 THOU/uL (ref 0.0–0.5)
Hematocrit: 44 % (ref 37–52)
Hemoglobin: 14.8 g/dL (ref 12.0–17.0)
IMM Granulocytes #: 0.1 THOU/uL — ABNORMAL HIGH (ref 0–0)
IMM Granulocytes: 0.5 %
Lymph # K/uL: 3.2 THOU/uL (ref 1.0–5.0)
MCV: 92 fL (ref 75–100)
Mono # K/uL: 1 THOU/uL (ref 0.1–1.0)
Neut # K/uL: 5.2 THOU/uL (ref 1.5–6.5)
Nucl RBC # K/uL: 0 THOU/uL
Nucl RBC %: 0 /100{WBCs} (ref 0.0–0.2)
Platelets: 213 THOU/uL (ref 150–450)
RBC: 4.8 MIL/uL (ref 4.0–6.0)
RDW: 11.9 % (ref 0.0–15.0)
Seg Neut %: 53.2 %
WBC: 9.8 THOU/uL (ref 3.5–11.0)

## 2024-06-27 LAB — COMPREHENSIVE METABOLIC PANEL
ALT: 26 U/L (ref 0–50)
AST: 24 U/L (ref 0–50)
Albumin: 4.8 g/dL (ref 3.5–5.2)
Alk Phos: 53 U/L (ref 40–130)
Anion Gap: 15 (ref 7–16)
Bilirubin,Total: 0.3 mg/dL (ref 0.0–1.2)
CO2: 23 mmol/L (ref 20–28)
Calcium: 9.7 mg/dL (ref 8.6–10.2)
Chloride: 102 mmol/L (ref 96–108)
Creatinine: 0.79 mg/dL (ref 0.67–1.17)
Glucose: 158 mg/dL — ABNORMAL HIGH (ref 60–99)
Lab: 19 mg/dL (ref 6–20)
Potassium: 4.5 mmol/L (ref 3.3–5.1)
Sodium: 140 mmol/L (ref 133–145)
Total Protein: 6.9 g/dL (ref 6.3–7.7)
eGFR BY CREAT: 108

## 2024-06-27 LAB — PSA (EFF.4-2010): PSA (eff. 4-2010): 0.22 ng/mL (ref 0.00–4.00)

## 2024-06-28 ENCOUNTER — Ambulatory Visit: Payer: Self-pay | Admitting: Internal Medicine

## 2024-06-28 LAB — HEMOGLOBIN A1C: Hemoglobin A1C: 10 % — ABNORMAL HIGH (ref <=5.6)

## 2024-07-07 ENCOUNTER — Other Ambulatory Visit: Payer: Self-pay | Admitting: Internal Medicine

## 2024-07-07 DIAGNOSIS — F411 Generalized anxiety disorder: Secondary | ICD-10-CM

## 2024-07-07 DIAGNOSIS — F332 Major depressive disorder, recurrent severe without psychotic features: Secondary | ICD-10-CM

## 2024-07-07 NOTE — Telephone Encounter (Signed)
 Refill request routed to Dr. Maurine 07/07/2024 8:06 AM

## 2024-07-17 ENCOUNTER — Encounter: Payer: Self-pay | Admitting: Internal Medicine

## 2024-07-17 DIAGNOSIS — E119 Type 2 diabetes mellitus without complications: Secondary | ICD-10-CM

## 2024-07-18 MED ORDER — DULAGLUTIDE 1.5 MG/0.5ML SC SOAJ *I*: 1.5000 mg | SUBCUTANEOUS | 1 refills | Status: DC

## 2024-08-10 ENCOUNTER — Ambulatory Visit: Admitting: Internal Medicine

## 2024-08-29 ENCOUNTER — Other Ambulatory Visit: Payer: Self-pay | Admitting: Internal Medicine

## 2024-08-29 ENCOUNTER — Other Ambulatory Visit: Payer: Self-pay

## 2024-08-29 DIAGNOSIS — E119 Type 2 diabetes mellitus without complications: Secondary | ICD-10-CM

## 2024-08-29 NOTE — Telephone Encounter (Signed)
 Medication request routed to Dr. Maurine for processing 08/29/2024 @8 :54 AM

## 2024-08-30 ENCOUNTER — Ambulatory Visit: Attending: Internal Medicine | Admitting: Internal Medicine

## 2024-08-30 ENCOUNTER — Encounter: Payer: Self-pay | Admitting: Internal Medicine

## 2024-08-30 VITALS — BP 115/84 | HR 84 | Temp 95.8°F | Ht 70.0 in | Wt 182.0 lb

## 2024-08-30 DIAGNOSIS — F32A Depression, unspecified: Secondary | ICD-10-CM | POA: Insufficient documentation

## 2024-08-30 DIAGNOSIS — E119 Type 2 diabetes mellitus without complications: Secondary | ICD-10-CM | POA: Insufficient documentation

## 2024-08-30 DIAGNOSIS — I10 Essential (primary) hypertension: Secondary | ICD-10-CM | POA: Insufficient documentation

## 2024-08-30 DIAGNOSIS — F419 Anxiety disorder, unspecified: Secondary | ICD-10-CM | POA: Insufficient documentation

## 2024-08-30 DIAGNOSIS — E785 Hyperlipidemia, unspecified: Secondary | ICD-10-CM | POA: Insufficient documentation

## 2024-08-30 LAB — POCT HEMOGLOBIN A1C: Hemoglobin A1C,POC: 8.9 % — ABNORMAL HIGH (ref <=5.6)

## 2024-08-30 MED ORDER — DULAGLUTIDE 3 MG/0.5ML SC SOAJ *I*: 3.0000 mg | SUBCUTANEOUS | 2 refills | Status: AC

## 2024-08-30 NOTE — Progress Notes (Signed)
 [facility] Follow-up Note ReasonReason For Visit: Follow up Assessment and Plan: Dale Schultz is a 49 y.o. male presenting for follow up of diabetes, HTN and anxiety. Assessment & Plan1. Diabetes mellitus: Stable. A1c levels have decreased within the past 2 months. Blood pressure readings are within the normal range. A1c down to 8.9 from 10. - Increase Trulicity  dosage to 3 mg.- Administer 1.5 mg of Trulicity  this week, followed by two separate injections of 2 mg each in the subsequent week.- Monitor A1c levels over the next 3 months.- New prescription for Trulicity  issued.- continue metformin  and jardiance . Current Diabetes Medications        dulaglutide  (TRULICITY ) 3 MG/0.5ML pen Inject 3 mg into the skin every 7 days.  JARDIANCE  25 MG tablet TAKE 1 TABLET BY MOUTH EVERY MORNING  metFORMIN  (GLUCOPHAGE -XR) 500 mg 24 hr tablet TAKE 4 TABLETS BY MOUTH EVERY DAY WITH BREAKFAST SWALLOW WHOLE, DO NOT BREAK, CRUSH OR CHEW  2. Anxiety/MDD: Stable.- Continue Celexa  and bupropion .- Positive sign of reduced anxiety noted as no longer biting fingernails.HTN- BP is at goal Follow-up- Follow up in 3 months.Diagnoses and all orders for this visit:Type 2 diabetes mellitus without complication, without long-term current use of insulin -     dulaglutide  (TRULICITY ) 3 MG/0.5ML pen; Inject 3 mg into the skin every 7 days.Essential hypertensionHyperlipidemia, unspecified hyperlipidemia typeAnxietyDepression, unspecified depression typeOther orders-     POCT HEMOGLOBIN A1CAuthor: Jackquline Canes, MD General Medicine DivisionP[facility] [address]/01/2025 10:09 AM  I personally spent 30 minutes on the calendar day of the encounter, including pre and post visit work.HPI:  Dale Schultz is 49 y.o. year old male coming in to discuss the following issues:History of Present  IllnessThe patient presents for evaluation of diabetes and anxiety.Diabetes- He reports a general sense of well-being and states that his diabetes is well-managed.- After discontinuing Ozempic , his A1c levels increased significantly, but his current treatment with Trulicity  has been effective.- He is not experiencing any gastrointestinal issues, which were previously a concern with Ozempic .- Initially, he delayed starting Trulicity  due to apprehension but has since had no adverse effects.- His medication regimen also includes metformin  and Jardiance .- He reports an increase in urination, which he attributes to aging and increased fluid intake, typically waking up once or twice during the night to use the bathroom.- He forgot to take his Trulicity  today and plans to take it tonight when he gets home.- He has 2 or 3 pens left.Anxiety- His mood has been stable on Celexa .- He has not seen Mliss in a while, which he attributes to time constraints.- He continues to struggle with feelings related to [place], which he finds distressing.- During a basketball game over break, the athletic director's son, who attended [employer], came over and said hi, which was particularly upsetting for him.- He is still trying to reconcile these feelings and thinks about them daily, but he manages to cope.- He feels better about his anxiety as he has stopped biting his fingernails, a habit he had for a long time.- Otherwise, he reports that work is fine, his kids are good, and there is nothing particularly stressful in his life.- Diet: Maintains a balanced diet, does not engage in excessive snacking- Sleep: Typically wakes up once or twice during the night to use the bathroomFAMILY HISTORY- Daughter has diabetesMost recent A1c was up to 10 from 6.7 previously. His trulicity  was increased to 1.5 mg weekly after last visit in November. He was previously unable to tolerate ozempic  due to  gastroparesis sxs.  Physical Exam: Vitals:  08/30/24 0850 BP: 115/84 BP Location: Right arm Patient Position: Sitting Cuff Size: large adult Pulse: 84 Temp: 35.4 C (95.8 F) TempSrc: Temporal SpO2: 99% Weight: 82.6 kg (182 lb) Height: 1.778 m (5' 10) Vitals Signs: AFP:Anib mass index is 26.11 kg/m., otherwise as above, reviewed with patientPhysical ExamResults- Labs:  - A1c: 8Additional Objective Data Listed Below.Review of Systems: 12 point ROS negative except for HPI above. Medications/Allergies/Immunizations: Medication list reviewed and reconciled this visit in the EMR. Allergy list confirmed in the EMR.Current Outpatient Medications Medication Sig  dulaglutide  (TRULICITY ) 3 MG/0.5ML pen Inject 3 mg into the skin every 7 days.  citalopram  (CELEXA ) 40 mg tablet TAKE 1 TABLET BY MOUTH EVERY DAY  atorvastatin  (LIPITOR) 20 mg tablet TAKE 1 TABLET BY MOUTH EVERY DAY  meloxicam  (MOBIC ) 15 mg tablet Take 1 tablet (15 mg total) by mouth daily. Take with food.  cyclobenzaprine  (FLEXERIL ) 5 mg tablet Take 1 tablet (5 mg total) by mouth 3 times daily as needed for Muscle spasms.  JARDIANCE  25 MG tablet TAKE 1 TABLET BY MOUTH EVERY MORNING  buPROPion  (WELLBUTRIN  XL) 300 mg 24 hr tablet TAKE 1 TABLET BY MOUTH EVERY MORNING - SWALLOW WHOLE, DO NOT BREAK, CRUSH OR CHEW  metFORMIN  (GLUCOPHAGE -XR) 500 mg 24 hr tablet TAKE 4 TABLETS BY MOUTH EVERY DAY WITH BREAKFAST SWALLOW WHOLE, DO NOT BREAK, CRUSH OR CHEW  buPROPion  (WELLBUTRIN  XL) 150 mg 24 hr tablet TAKE 1 TABLET BY MOUTH EVERY MORNING SWALLOW WHOLE, DO NOT BREAK, CRUSH OR CHEW  hydrOXYzine  HCl (ATARAX ) 25 MG tablet Take 0.5-1 tablets (12.5-25 mg total) by mouth at bedtime  insulin  pen needle (B-D UF III MINI PEN NEEDLES) 31G X 5 MM USE AS DIRECTED  alcohol  swabs  pads Use as directed  BAYER CONTOUR test strip tes 2-3 times daily as needed for 250.02  LANCETS ULTRA  THIN MISC Test 3 times daily for 250.02 Past Medical History/Family History/Social History: Past Medical History[1]Past Medical History, Family History, and Social History reviewed, confirmed, and updated as appropriate this visit in the EMR. Labs and Imaging: Labs and Imaging results reviewed with patient.  [1] Past Medical History:Diagnosis Date  Anxiety   Diabetes mellitus   HLD (hyperlipidemia)

## 2024-11-29 ENCOUNTER — Ambulatory Visit: Admitting: Internal Medicine
